# Patient Record
Sex: Male | Born: 1950
Health system: Southern US, Community
[De-identification: ages and names within clinical notes are randomized; demographics above are authoritative.]

## PROBLEM LIST (undated history)

## (undated) DIAGNOSIS — G2581 Restless legs syndrome: Secondary | ICD-10-CM

## (undated) DIAGNOSIS — E785 Hyperlipidemia, unspecified: Secondary | ICD-10-CM

## (undated) DIAGNOSIS — E039 Hypothyroidism, unspecified: Secondary | ICD-10-CM

## (undated) DIAGNOSIS — U071 COVID-19: Secondary | ICD-10-CM

## (undated) HISTORY — PX: REPLACEMENT TOTAL KNEE: SUR1224

## (undated) HISTORY — DX: Restless legs syndrome: G25.81

## (undated) HISTORY — PX: TONSILLECTOMY: SUR1361

## (undated) HISTORY — DX: Hypothyroidism, unspecified: E03.9

## (undated) HISTORY — DX: Hyperlipidemia, unspecified: E78.5

## (undated) HISTORY — DX: COVID-19: U07.1

---

## 1988-03-25 DIAGNOSIS — D869 Sarcoidosis, unspecified: Secondary | ICD-10-CM

## 1988-03-25 HISTORY — DX: Sarcoidosis, unspecified: D86.9

## 2015-08-31 ENCOUNTER — Ambulatory Visit (INDEPENDENT_AMBULATORY_CARE_PROVIDER_SITE_OTHER): Payer: BLUE CROSS/BLUE SHIELD | Admitting: Sports Medicine

## 2015-08-31 ENCOUNTER — Encounter: Payer: Self-pay | Admitting: Sports Medicine

## 2015-08-31 ENCOUNTER — Ambulatory Visit (INDEPENDENT_AMBULATORY_CARE_PROVIDER_SITE_OTHER): Payer: BLUE CROSS/BLUE SHIELD

## 2015-08-31 DIAGNOSIS — M722 Plantar fascial fibromatosis: Secondary | ICD-10-CM

## 2015-08-31 DIAGNOSIS — M79671 Pain in right foot: Secondary | ICD-10-CM

## 2015-08-31 MED ORDER — MELOXICAM 15 MG PO TABS
15.0000 mg | ORAL_TABLET | Freq: Every day | ORAL | Status: DC
Start: 2015-08-31 — End: 2015-10-12

## 2015-08-31 MED ORDER — TRIAMCINOLONE ACETONIDE 10 MG/ML IJ SUSP: 10.0000 mg | Freq: Once | INTRAMUSCULAR | Status: AC

## 2015-08-31 NOTE — Progress Notes (Signed)
Patient ID: Todd Ramirez, male   DOB: 02-May-1950, 64 y.o.   MRN: [id number] Subjective: Todd Ramirez is a 65 y.o. male patient presents to office with complaint of heel pain on the right. Patient admits to post static dyskinesia for 1 month in duration. Patient has treated this problem with wearing his old custom inserts with no relief. Denies any other pedal complaints.   There are no active problems to display for this patient.   No current outpatient prescriptions on file prior to visit.   No current facility-administered medications on file prior to visit.    No Known Allergies  Objective: Physical Exam General: The patient is alert and oriented x3 in no acute distress.  Dermatology: Skin is warm, dry and supple bilateral lower extremities. Nails 1-10 are normal. There is no erythema, edema, no eccymosis, no open lesions present. Integument is otherwise unremarkable.  Vascular: Dorsalis Pedis pulse and Posterior Tibial pulse are 2/4 bilateral. Capillary fill time is immediate to all digits.  Neurological: Grossly intact to light touch with an achilles reflex of +2/5 and a  negative Tinel's sign bilateral.  Musculoskeletal: Tenderness to palpation at the medial calcaneal tubercale and through the insertion of the plantar fascia on the right foot. No pain with compression of calcaneus bilateral. No pain with tuning fork to calcaneus bilateral. No pain with calf compression bilateral. There is decreased Ankle joint range of motion bilateral. All other joints range of motion within normal limits bilateral. Strength 5/5 in all groups bilateral. Mild asymptomatic hammertoe bilateral.   Xray, Right foot:  Normal osseous mineralization. Joint spaces preserved except at midfoot where there are changes suggestive of arthritis. Mild hammertoe. No fracture/dislocation/boney destruction. Calcaneal spur present with mild thickening of plantar fascia. No other soft tissue abnormalities or radiopaque  foreign bodies.   Assessment and Plan: Problem List Items Addressed This Visit    None    Visit Diagnoses    Right foot pain    -  Primary    Relevant Medications    triamcinolone acetonide (KENALOG) 10 MG/ML injection 10 mg    meloxicam (MOBIC) 15 MG tablet    Other Relevant Orders    DG Foot 2 Views Right    Plantar fasciitis of right foot        Relevant Medications    triamcinolone acetonide (KENALOG) 10 MG/ML injection 10 mg    meloxicam (MOBIC) 15 MG tablet       -Complete examination performed.  -Xrays reviewed -Discussed with patient in detail the condition of plantar fasciitis, how this occurs and general treatment options. Explained both conservative and surgical treatments.  -After oral consent and aseptic prep, injected a mixture containing 1 ml of 2%  plain lidocaine, 1 ml 0.5% plain marcaine, 0.5 ml of kenalog 10 and 0.5 ml of dexamethasone phosphate into right heel. Post-injection care discussed with patient.  -Rx Meloxicam -Recommended good supportive shoes and advised use of OTC insert. Explained to patient that if these orthoses work well, we will continue with these. If these do not improve his condition and  pain, we will consider custom molded orthoses. -Explained in detail the use of the fascial brace for right which was dispensed at today's visit. -Explained and dispensed to patient daily stretching exercises. -Recommend patient to ice affected area 1-2x daily. -Patient to return to office in 3 weeks for follow up or sooner if problems or questions arise. If pain is better will scan for new set of custom orthotics.  Landis Martins, DPM

## 2015-08-31 NOTE — Patient Instructions (Signed)

## 2015-09-21 ENCOUNTER — Ambulatory Visit (INDEPENDENT_AMBULATORY_CARE_PROVIDER_SITE_OTHER): Payer: BLUE CROSS/BLUE SHIELD | Admitting: Sports Medicine

## 2015-09-21 ENCOUNTER — Encounter: Payer: Self-pay | Admitting: Sports Medicine

## 2015-09-21 DIAGNOSIS — M79671 Pain in right foot: Secondary | ICD-10-CM | POA: Diagnosis not present

## 2015-09-21 DIAGNOSIS — M722 Plantar fascial fibromatosis: Secondary | ICD-10-CM

## 2015-09-21 NOTE — Progress Notes (Signed)
Patient ID: Todd Ramirez, male   DOB: [birth year], 65 y.o.   MRN: [id number] Subjective: Todd Ramirez is a 65 y.o. male returns to office for follow up evaluation after Right heel injection for plantar fasciitis, injection #1 administered 3 weeks ago. Patient states that the injection seems to help his pain; decreased in frequency to the area but still most bothersome with first few steps out of bed in the AM. Patient denies any recent changes in medications or new problems since last visit.   There are no active problems to display for this patient.   Current Outpatient Prescriptions on File Prior to Visit  Medication Sig Dispense Refill  . LEVOTHYROXINE SODIUM PO Take by mouth.    . meloxicam (MOBIC) 15 MG tablet Take 1 tablet (15 mg total) by mouth daily. 30 tablet 0   Current Facility-Administered Medications on File Prior to Visit  Medication Dose Route Frequency Provider Last Rate Last Dose  . triamcinolone acetonide (KENALOG) 10 MG/ML injection 10 mg  10 mg Other Once [employer], DPM        No Known Allergies  Objective:   General:  Alert and oriented x 3, in no acute distress  Dermatology: Skin is warm, dry, and supple bilateral. Nails are within normal limits. There is no lower extremity erythema, no eccymosis, no open lesions present bilateral.   Vascular: Dorsalis Pedis and Posterior Tibial pedal pulses are 2/4 bilateral. + hair growth noted bilateral. Capillary Fill Time is 3 seconds in all digits. No varicosities, No edema bilateral lower extremities.   Neurological: Sensation grossly intact to light touch with an achilles reflex of +2 and a  negative Tinel's sign bilateral. Vibratory, sharp/dull, Semmes Weinstein Monofilament within normal limits.   Musculoskeletal: There is decreased tenderness to palpation at the medial calcaneal tubercale and through the insertion of the plantar fascia on the right foot. No pain with compression to calcaneus or application of tuning fork.  There is decreased Ankle joint range of motion bilateral. All other jointsrange of motion  within normal limits bilateral. Strength 5/5 bilateral.   Assessment and Plan: Problem List Items Addressed This Visit    None    Visit Diagnoses    Plantar fasciitis of right foot    -  Primary    Right foot pain          -Complete examination performed.  -Previous x-rays reviewed. -Discussed with patient in detail the condition of plantar fasciitis, how this  occurs related to the foot type of the patient and general treatment options. -No re-injection today due to minimal symptom -Dispensed night splint, right foot -Continue with Mobic until completed -Continue with stretching, icing, good supportive shoes, inserts daily. Encouraged patient to ice since he has not been doing so -Discussed long term care and reocurrence; will closely monitor; if fails to improve will consider other treatment modalities.  -Patient to return to office in 3-4 weeks for follow up or sooner if problems or questions arise.  Landis Martins, DPM

## 2015-10-12 ENCOUNTER — Ambulatory Visit (INDEPENDENT_AMBULATORY_CARE_PROVIDER_SITE_OTHER): Payer: BLUE CROSS/BLUE SHIELD | Admitting: Sports Medicine

## 2015-10-12 ENCOUNTER — Encounter: Payer: Self-pay | Admitting: Sports Medicine

## 2015-10-12 DIAGNOSIS — M722 Plantar fascial fibromatosis: Secondary | ICD-10-CM | POA: Diagnosis not present

## 2015-10-12 DIAGNOSIS — M79671 Pain in right foot: Secondary | ICD-10-CM | POA: Diagnosis not present

## 2015-10-12 MED ORDER — MELOXICAM 15 MG PO TABS: 15.0000 mg | ORAL_TABLET | Freq: Every day | ORAL | Status: DC

## 2015-10-12 MED ORDER — TRIAMCINOLONE ACETONIDE 10 MG/ML IJ SUSP: 10.0000 mg | Freq: Once | INTRAMUSCULAR | Status: AC

## 2015-10-12 NOTE — Progress Notes (Signed)
Patient ID: Todd Ramirez, male   DOB: 04-20-1950, 64 y.o.   MRN: [id number]  Subjective: Todd Ramirez is a 65 y.o. male returns to office for follow up evaluation after Right heel injection for plantar fasciitis, injection #1 administered 6 weeks ago. Patient states that the injection seems to help his pain;decreased in frequency to the area but still painful with most experience pain last week after going on a long walk with the dogs and after mowing grass. Patient denies any recent changes in medications or new problems since last visit.   There are no active problems to display for this patient.   Current Outpatient Prescriptions on File Prior to Visit  Medication Sig Dispense Refill  . LEVOTHYROXINE SODIUM PO Take by mouth.     Current Facility-Administered Medications on File Prior to Visit  Medication Dose Route Frequency Provider Last Rate Last Dose  . triamcinolone acetonide (KENALOG) 10 MG/ML injection 10 mg  10 mg Other Once [employer], DPM        No Known Allergies  Objective:   General:  Alert and oriented x 3, in no acute distress  Dermatology: Skin is warm, dry, and supple bilateral. Nails are within normal limits. There is no lower extremity erythema, no eccymosis, no open lesions present bilateral.   Vascular: Dorsalis Pedis and Posterior Tibial pedal pulses are 2/4 bilateral. + hair growth noted bilateral. Capillary Fill Time is 3 seconds in all digits. No varicosities, No edema bilateral lower extremities.   Neurological: Sensation grossly intact to light touch with an achilles reflex of +2 and a  negative Tinel's sign bilateral. Vibratory, sharp/dull, Semmes Weinstein Monofilament within normal limits.   Musculoskeletal: There is tenderness to palpation at the medial calcaneal tubercale and through the insertion of the plantar fascia on the right foot. No pain with compression to calcaneus or application of tuning fork. There is decreased Ankle joint range of motion  bilateral. All other jointsrange of motion  within normal limits bilateral. Strength 5/5 bilateral.   Assessment and Plan: Problem List Items Addressed This Visit    None    Visit Diagnoses    Right foot pain    -  Primary    Relevant Medications    meloxicam (MOBIC) 15 MG tablet    triamcinolone acetonide (KENALOG) 10 MG/ML injection 10 mg    Plantar fasciitis of right foot        Relevant Medications    meloxicam (MOBIC) 15 MG tablet    triamcinolone acetonide (KENALOG) 10 MG/ML injection 10 mg      -Complete examination performed.  -Previous x-rays reviewed. -Discussed with patient in detail the condition of plantar fasciitis, how this  occurs related to the foot type of the patient and general treatment options. -After oral consent and aseptic prep, injected a mixture containing 1 ml of 2%  plain lidocaine, 1 ml 0.5% plain marcaine, 0.5 ml of kenalog 40 and 0.5 ml of dexamethasone phosphate into right heel at plantar fascia insertion medially without complication. Post-injection care discussed with patient. This is injection #2. -Continue with night splint, right foot -Refilled mobic to take for acute exacerbations or pain -Continue with stretching, icing, good supportive shoes, inserts daily. Encouraged patient to return to icing more consistently -Discussed long term care and reocurrence; will closely monitor; if fails to improve will consider other treatment modalities.  -Patient to return to office in 3-4 weeks for follow up or sooner if problems or questions arise. Advised patient that if he  is better with less flareups or recurrence, We'll scan and have orthotics made at next visit.  Landis Martins, DPM

## 2015-11-09 ENCOUNTER — Ambulatory Visit (INDEPENDENT_AMBULATORY_CARE_PROVIDER_SITE_OTHER): Payer: BLUE CROSS/BLUE SHIELD | Admitting: Sports Medicine

## 2015-11-09 ENCOUNTER — Encounter: Payer: Self-pay | Admitting: Sports Medicine

## 2015-11-09 DIAGNOSIS — M79671 Pain in right foot: Secondary | ICD-10-CM | POA: Diagnosis not present

## 2015-11-09 DIAGNOSIS — M722 Plantar fascial fibromatosis: Secondary | ICD-10-CM

## 2015-11-09 NOTE — Progress Notes (Signed)
  Patient ID: Todd Ramirez, male   DOB: Aug 20, 1950, 65 y.o.   MRN: [id number]  Subjective: Todd Ramirez is a 65 y.o. male returns to office for follow up evaluation after Right heel injection for plantar fasciitis, injection #2 administered 4 weeks ago. Patient states that the injection seems to help his pain; 100% resolved. Patient denies any recent changes in medications or new problems since last visit.   There are no active problems to display for this patient.   Current Outpatient Prescriptions on File Prior to Visit  Medication Sig Dispense Refill  . LEVOTHYROXINE SODIUM PO Take by mouth.    . meloxicam (MOBIC) 15 MG tablet Take 1 tablet (15 mg total) by mouth daily. 30 tablet 0   Current Facility-Administered Medications on File Prior to Visit  Medication Dose Route Frequency Provider Last Rate Last Dose  . triamcinolone acetonide (KENALOG) 10 MG/ML injection 10 mg  10 mg Other Once [employer], DPM      . triamcinolone acetonide (KENALOG) 10 MG/ML injection 10 mg  10 mg Other Once [employer], DPM        No Known Allergies  Objective:   General:  Alert and oriented x 3, in no acute distress  Dermatology: Skin is warm, dry, and supple bilateral. Nails are within normal limits. There is no lower extremity erythema, no eccymosis, no open lesions present bilateral.   Vascular: Dorsalis Pedis and Posterior Tibial pedal pulses are 2/4 bilateral. + hair growth noted bilateral. Capillary Fill Time is 3 seconds in all digits. No varicosities, No edema bilateral lower extremities.   Neurological: Sensation grossly intact to light touch with an achilles reflex of +2 and a negative Tinel's sign bilateral. Vibratory, sharp/dull, Semmes Weinstein Monofilament within normal limits.   Musculoskeletal: There is no tenderness to palpation at the medial calcaneal tubercale and through the insertion of the plantar fascia on the right foot. No pain with compression to calcaneus or application of  tuning fork. There is decreased Ankle joint range of motion bilateral. All other jointsrange of motion  within normal limits bilateral. Strength 5/5 bilateral.   Orthotic exam: Appear to contoured arch well however top cover is worn  Assessment and Plan: Problem List Items Addressed This Visit    None    Visit Diagnoses    Right foot pain    -  Primary   Plantar fasciitis of right foot         -Complete examination performed.  -Previous x-rays reviewed. -Discussed with patient in detail the condition of plantar fasciitis, And long term care -Patient to slowly wean from icing and use of the fascial brace and night splint, right foot as instructed -Continue with stretching, good supportive shoes, custom inserts daily. Advised patient that orthotics currently has fit arch however, if he is interested in getting a new pair or his current. Once refurbished to let me know -Patient to return to office as needed.   Landis Martins, DPM

## 2016-04-01 ENCOUNTER — Encounter: Payer: Self-pay | Admitting: Pulmonary Disease

## 2016-04-01 ENCOUNTER — Ambulatory Visit (INDEPENDENT_AMBULATORY_CARE_PROVIDER_SITE_OTHER): Payer: BLUE CROSS/BLUE SHIELD | Admitting: Pulmonary Disease

## 2016-04-01 VITALS — BP 120/90 | HR 61 | Ht 69.0 in | Wt 220.0 lb

## 2016-04-01 DIAGNOSIS — D869 Sarcoidosis, unspecified: Secondary | ICD-10-CM | POA: Diagnosis not present

## 2016-04-01 LAB — NITRIC OXIDE: Nitric Oxide: 13

## 2016-04-01 MED ORDER — AZELASTINE-FLUTICASONE 137-50 MCG/ACT NA SUSP: 2.0000 | Freq: Every day | NASAL | 1 refills | Status: DC

## 2016-04-01 MED ORDER — CHLORPHENIRAMINE MALEATE 4 MG PO TABS: 8.0000 mg | ORAL_TABLET | Freq: Three times a day (TID) | ORAL | 1 refills | Status: DC

## 2016-04-01 NOTE — Patient Instructions (Signed)
We'll check an FENO, PFTs You will be scheduled for a high-resolution CT We'll start him on Chlorpheniramine 8 mg 3 times daily and dymista nasal spray.  Return in 1 month.

## 2016-04-01 NOTE — Progress Notes (Signed)
Todd Ramirez    [id number]    24-Nov-1950  Primary Care Physician:GAGE, Fulton Mole, MD  Referring Physician: Ocie Doyne, MD [place], [place] [id number]  Chief complaint: Evaluation for chronic cough  HPI: Todd Ramirez is a 66 year old with past medical history of hypothyroidism, chronic cough, restless leg syndrome. He has complains of cough on and off for the past 20 years which is nonproductive in nature. He reports worsening of his symptoms for the past couple of months. He has paroxysms of cough which gets short of breath. He denies any wheezing, sputum production, fevers, chills. He was started on prednisone, Singulair, doxycycline last week by his primary care physician Dr. Micheal Likens which has helped with the symptoms.   He is a lifelong nonsmoker. 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 He reports a diagnosis of sarcoid in 1990 but does not recall getting a biopsy. He was treated with an anti-inflammatory therapy but is not sure if this was a steroid. He also has reports of wake arthritis symptoms. He had been seen by an allergist and also had a barium swallow which was negative for any evidence of aspiration.  Outpatient Encounter Prescriptions as of 04/01/2016  Medication Sig  . Doxycycline Hyclate (DOXY-CAPS PO) Take by mouth.  . levothyroxine (SYNTHROID, LEVOTHROID) 50 MCG tablet Take 50 mcg by mouth daily.  . montelukast (SINGULAIR) 10 MG tablet Take 10 mg by mouth daily.  . predniSONE (DELTASONE) 10 MG tablet TAKE 4 TABLETS FOR 2 DAYS, 3 TABLETS FOR 2 DAYS, 2 TABLETS FOR 2 DAYS, 1 TABLET FOR 2 DAYS.  Marland Kitchen rOPINIRole (REQUIP) 1 MG tablet TAKE 1 TABLET AT BEDTIME (MAXIMUM OF 4 TABLETS AT BEDTIME)  . LEVOTHYROXINE SODIUM PO Take by mouth.  . meloxicam (MOBIC) 15 MG tablet Take 1 tablet (15 mg total) by mouth daily. (Patient not taking: Reported on 04/01/2016)   Facility-Administered Encounter Medications as of 04/01/2016  Medication  . triamcinolone acetonide (KENALOG) 10 MG/ML  injection 10 mg  . triamcinolone acetonide (KENALOG) 10 MG/ML injection 10 mg    Allergies as of 04/01/2016  . (No Known Allergies)    No past medical history on file.  No past surgical history on file.  No family history on file.  Social History   Social History  . Marital status: Married    Spouse name: N/A  . Number of children: N/A  . Years of education: N/A   Occupational History  . Not on file.   Social History Main Topics  . Smoking status: Never Smoker  . Smokeless tobacco: Never Used  . Alcohol use Not on file  . Drug use: Unknown  . Sexual activity: Not on file   Other Topics Concern  . Not on file   Social History Narrative  . No narrative on file   Review of systems: Review of Systems  Constitutional: Negative for fever and chills.  HENT: Negative.   Eyes: Negative for blurred vision.  Respiratory: as per HPI  Cardiovascular: Negative for chest pain and palpitations.  Gastrointestinal: Negative for vomiting, diarrhea, blood per rectum. Genitourinary: Negative for dysuria, urgency, frequency and hematuria.  Musculoskeletal: Negative for myalgias, back pain and joint pain.  Skin: Negative for itching and rash.  Neurological: Negative for dizziness, tremors, focal weakness, seizures and loss of consciousness.  Endo/Heme/Allergies: Negative for environmental allergies.  Psychiatric/Behavioral: Negative for depression, suicidal ideas and hallucinations.  All other systems reviewed and are  negative.  Physical Exam: Blood pressure 120/90, pulse 61, height 5\' 9"  (1.753 m), weight 220 lb (99.8 kg), SpO2 94 %. Gen:      No acute distress HEENT:  EOMI, sclera anicteric Neck:     No masses; no thyromegaly Lungs:    Clear to auscultation bilaterally; normal respiratory effort CV:         Regular rate and rhythm; no murmurs Abd:      + bowel sounds; soft, non-tender; no palpable masses, no distension Ext:    No edema; adequate peripheral perfusion Skin:       Warm and dry; no rash Neuro: alert and oriented x 3 Psych: normal mood and affect  Data Reviewed: Chest x-ray 03/29/16-no acute cardio pulmonary abnormality. Images reviewed. FENO 04/01/16- 13  Assessment:  Evaluation for chronic cough I suspect his cough is upper airway cough syndrome mostly from postnasal drip. He does not have any symptoms of GERD, aspiration. He gives a vague history of sarcoidosis in the 1990s but his recent chest x-ray was normal.  Low FENO in office today argues against asthma. He is on Requip for restless leg syndrome. He does snore occasionally at night but has not been tested for sleep apnea.  I will reevaluate with an  high-resolution CT of the chest and PFTs. He will get started on chlorpheniramine antihistamine 8 mg 3 times daily and dymista nasal spray and also get records from Dr. Viviann Spare office to get a better idea of his sarcoidosis history. He may need a sleep study for evaluation of OSA. I will address this at next visit.   I educated him on behavioral changes to deal with cough including conscious suppression of the urge to cough, use of throat lozenges.  Plan/Recommendations: - PFTs, High res CT of chest - Chlorpheniramine and dymista - Get records from PCP  Marshell Garfinkel MD [facility] Pager [phone] 04/01/2016, 11:59 AM  CC: Ocie Doyne., MD

## 2016-04-10 ENCOUNTER — Inpatient Hospital Stay: Admission: RE | Admit: 2016-04-10 | Payer: BLUE CROSS/BLUE SHIELD | Source: Ambulatory Visit

## 2016-04-12 ENCOUNTER — Inpatient Hospital Stay: Admission: RE | Admit: 2016-04-12 | Payer: BLUE CROSS/BLUE SHIELD | Source: Ambulatory Visit

## 2016-04-18 ENCOUNTER — Ambulatory Visit (INDEPENDENT_AMBULATORY_CARE_PROVIDER_SITE_OTHER)
Admission: RE | Admit: 2016-04-18 | Discharge: 2016-04-18 | Disposition: A | Discharge status: Home / Self Care | Payer: BLUE CROSS/BLUE SHIELD | Source: Ambulatory Visit | Attending: Pulmonary Disease | Admitting: Pulmonary Disease

## 2016-04-18 DIAGNOSIS — D869 Sarcoidosis, unspecified: Secondary | ICD-10-CM

## 2016-04-25 ENCOUNTER — Telehealth: Payer: Self-pay | Admitting: Pulmonary Disease

## 2016-04-25 NOTE — Telephone Encounter (Signed)
Notes Recorded by Marshell Garfinkel, MD on 04/19/2016 at 11:45 AM EST Please let the patient know that CT does not show any evidence of interstitial lung disease or sarcoidosis. He has some fat in liver and also noted is atherosclerosis in coronary vessels that may indicate he has cardiac diease. Please have him follow up with his primary care physician.  lmtcb X1

## 2016-04-29 NOTE — Telephone Encounter (Signed)
Spoke with pt. He is aware of results. Nothing further was needed. 

## 2016-05-02 ENCOUNTER — Ambulatory Visit (HOSPITAL_COMMUNITY)
Admission: RE | Admit: 2016-05-02 | Discharge: 2016-05-02 | Disposition: A | Discharge status: Home / Self Care | Payer: BLUE CROSS/BLUE SHIELD | Source: Ambulatory Visit | Attending: Pulmonary Disease | Admitting: Pulmonary Disease

## 2016-05-02 ENCOUNTER — Encounter: Payer: Self-pay | Admitting: Pulmonary Disease

## 2016-05-02 ENCOUNTER — Ambulatory Visit (INDEPENDENT_AMBULATORY_CARE_PROVIDER_SITE_OTHER): Payer: BLUE CROSS/BLUE SHIELD | Admitting: Pulmonary Disease

## 2016-05-02 VITALS — BP 130/76 | HR 94 | Ht 69.0 in | Wt 215.6 lb

## 2016-05-02 DIAGNOSIS — D869 Sarcoidosis, unspecified: Secondary | ICD-10-CM | POA: Insufficient documentation

## 2016-05-02 DIAGNOSIS — G4733 Obstructive sleep apnea (adult) (pediatric): Secondary | ICD-10-CM

## 2016-05-02 LAB — PULMONARY FUNCTION TEST
DL/VA % PRED: 84 %
DL/VA: 3.83 ml/min/mmHg/L
DLCO UNC % PRED: 73 %
DLCO unc: 22.65 ml/min/mmHg
FEF 25-75 POST: 2.99 L/s
FEF 25-75 Pre: 4.3 L/sec
FEF2575-%CHANGE-POST: -30 %
FEF2575-%PRED-POST: 117 %
FEF2575-%Pred-Pre: 168 %
FEV1-%CHANGE-POST: -7 %
FEV1-%Pred-Post: 102 %
FEV1-%Pred-Pre: 111 %
FEV1-POST: 3.33 L
FEV1-Pre: 3.62 L
FEV1FVC-%CHANGE-POST: -1 %
FEV1FVC-%Pred-Pre: 112 %
FEV6-%CHANGE-POST: -6 %
FEV6-%Pred-Post: 97 %
FEV6-%Pred-Pre: 104 %
FEV6-PRE: 4.33 L
FEV6-Post: 4.04 L
FEV6FVC-%Change-Post: 0 %
FEV6FVC-%PRED-PRE: 105 %
FEV6FVC-%Pred-Post: 105 %
FVC-%CHANGE-POST: -6 %
FVC-%PRED-POST: 92 %
FVC-%Pred-Pre: 98 %
FVC-Post: 4.05 L
FVC-Pre: 4.33 L
POST FEV1/FVC RATIO: 82 %
PRE FEV6/FVC RATIO: 100 %
Post FEV6/FVC ratio: 100 %
Pre FEV1/FVC ratio: 84 %
RV % PRED: 88 %
RV: 2.05 L
TLC % pred: 97 %
TLC: 6.67 L

## 2016-05-02 MED ORDER — ALBUTEROL SULFATE (2.5 MG/3ML) 0.083% IN NEBU
2.5000 mg | INHALATION_SOLUTION | Freq: Once | RESPIRATORY_TRACT | Status: AC
  Administered 2016-05-02: 2.5 mg via RESPIRATORY_TRACT

## 2016-05-02 MED ORDER — PANTOPRAZOLE SODIUM 40 MG PO TBEC: 40.0000 mg | DELAYED_RELEASE_TABLET | Freq: Every day | ORAL | 2 refills | Status: DC

## 2016-05-02 NOTE — Progress Notes (Signed)
Todd Ramirez    [id number]    10/23/1950  Primary Care Physician:Todd Ramirez, Todd Mole, MD  Referring Physician: Ocie Doyne, MD [place], [place] [address]  Chief complaint: Follow-up for chronic cough  HPI: Mr. Todd Ramirez is a 66 year old with past medical history of hypothyroidism, chronic cough, restless leg syndrome. He has complains of cough on and off for the past 20 years which is nonproductive in nature. He reports worsening of his symptoms for the past couple of months. He has paroxysms of cough which gets short of breath. He denies any wheezing, sputum production, fevers, chills. He was started on prednisone, Singulair, doxycycline last week by his primary care physician Dr. Micheal Ramirez which has helped with the symptoms.   He is a lifelong nonsmoker. 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 He reports a diagnosis of sarcoid in 1990 but does not recall getting a biopsy. He was treated with an anti-inflammatory therapy but is not sure if this was a steroid. He also has reports of wake arthritis symptoms. He had been seen by an allergist and also had a barium swallow which was negative for any evidence of aspiration.  Interim History: Feels improved and he states that the cough is better with no paroxysms but he has some ongoing chronic cough at baseline. He denies any fevers, chills, dyspnea, wheezing.  Outpatient Encounter Prescriptions as of 05/02/2016  Medication Sig  . Azelastine-Fluticasone 137-50 MCG/ACT SUSP Place 2 sprays into both nostrils daily.  . chlorpheniramine (CHLOR-TRIMETON) 4 MG tablet Take 2 tablets (8 mg total) by mouth 3 (three) times daily.  Marland Kitchen levothyroxine (SYNTHROID, LEVOTHROID) 50 MCG tablet Take 50 mcg by mouth daily.  . meloxicam (MOBIC) 15 MG tablet Take 1 tablet (15 mg total) by mouth daily.  Marland Kitchen rOPINIRole (REQUIP) 1 MG tablet TAKE 1 TABLET AT BEDTIME (MAXIMUM OF 4 TABLETS AT BEDTIME)  . [DISCONTINUED] Doxycycline Hyclate (DOXY-CAPS PO) Take by mouth.  .  [DISCONTINUED] LEVOTHYROXINE SODIUM PO Take by mouth.  . [DISCONTINUED] montelukast (SINGULAIR) 10 MG tablet Take 10 mg by mouth daily.  . [DISCONTINUED] predniSONE (DELTASONE) 10 MG tablet TAKE 4 TABLETS FOR 2 DAYS, 3 TABLETS FOR 2 DAYS, 2 TABLETS FOR 2 DAYS, 1 TABLET FOR 2 DAYS.   Facility-Administered Encounter Medications as of 05/02/2016  Medication  . triamcinolone acetonide (KENALOG) 10 MG/ML injection 10 mg  . triamcinolone acetonide (KENALOG) 10 MG/ML injection 10 mg    Allergies as of 05/02/2016  . (No Known Allergies)    No past medical history on file.  No past surgical history on file.  No family history on file.  Social History   Social History  . Marital status: Married    Spouse name: N/A  . Number of children: N/A  . Years of education: N/A   Occupational History  . Not on file.   Social History Main Topics  . Smoking status: Never Smoker  . Smokeless tobacco: Never Used  . Alcohol use Not on file  . Drug use: Unknown  . Sexual activity: Not on file   Other Topics Concern  . Not on file   Social History Narrative  . No narrative on file   Review of systems: Review of Systems  Constitutional: Negative for fever and chills.  HENT: Negative.   Eyes: Negative for blurred vision.  Respiratory: as per HPI  Cardiovascular: Negative for chest pain and palpitations.  Gastrointestinal: Negative for vomiting, diarrhea, blood  per rectum. Genitourinary: Negative for dysuria, urgency, frequency and hematuria.  Musculoskeletal: Negative for myalgias, back pain and joint pain.  Skin: Negative for itching and rash.  Neurological: Negative for dizziness, tremors, focal weakness, seizures and loss of consciousness.  Endo/Heme/Allergies: Negative for environmental allergies.  Psychiatric/Behavioral: Negative for depression, suicidal ideas and hallucinations.  All other systems reviewed and are negative.  Physical Exam: Blood pressure 130/76, pulse 94, height 5'  9" (1.753 m), weight 97.8 kg (215 lb 9.6 oz), SpO2 95 %. Gen:      No acute distress HEENT:  EOMI, sclera anicteric Neck:     No masses; no thyromegaly Lungs:    Clear to auscultation bilaterally; normal respiratory effort CV:         Regular rate and rhythm; no murmurs Abd:      + bowel sounds; soft, non-tender; no palpable masses, no distension Ext:    No edema; adequate peripheral perfusion Skin:      Warm and dry; no rash Neuro: alert and oriented x 3 Psych: normal mood and affect Data Reviewed: Chest x-ray 03/29/16-no acute cardio pulmonary abnormality. Images reviewed. CT high resolution 04/18/16- no evidence of interstitial lung disease or sarcoidosis. Aortic and coronary atherosclerosis, hepatic steatosis.  FENO 04/01/16- 13  PFTs 05/02/16 FVC 4.05 [92%) FEV1 3.33 [102%) F/F 82 TLC 97% DLCO 73% Minimal diffusion defect.  Assessment:  Follow up for chronic cough Cough is likely upper airway cough syndrome mostly from postnasal drip. He has a vague history of sarcoidosis in the 1990s but CT of the chest does not show any evidence of this or other interstitial lung disease. Low FENO in office argues against asthma. PFTs were reviewed with him today which not show any obstruction but a minimal diffusion defect.  He will continue on the chlorpheniramine and dymista. He does not have any overt GERD symptoms but we will start him on Protonix for empiric treatment of acid reflux.   I have reviewed his CT and PFTs with him today. He'll follow up with his primary care for evaluation of the coronary atherosclerosis and hepatic steatosis  Eval for OSA He is in requip for restless leg syndrome with symptoms of snoring, witnessed apneas and daytime sleepiness. We'll set him up with a sleep study for further evaluation  Plan/Recommendations: - Continue Chlorpheniramine and dymista - Start protonix 40 mg qd - Sleep study  Todd Garfinkel MD [facility] Pager [phone]  [phone] 05/02/2016, 12:12 PM  CC: Todd Ramirez., MD

## 2016-05-02 NOTE — Patient Instructions (Signed)
We discussed your lung function tests and CT scan today. If any lung function tests show very minimal changes which are not of great concern. Your CT scan does not show any evidence of sarcoid or lung abnormalities. There is evidence of plaque buildup in the blood vessels supplying your heart. This may indicate that you are developing coronary artery disease. You can follow up with your primary care or cardiology for further evaluation. There is also evidence of fatty liver on the CT scan.  Regarding your cough I don't believe it's coming from the lungs This is likely from postnasal drip and silent acid reflux Continue chlorpheniramine antihistamine and dymista nasal spray. We will start you on Protonix 40 mg daily for acid reflux We will also schedule for a sleep study for evaluation of sleep apnea  Return to clinic in 3 months.

## 2016-05-06 NOTE — Progress Notes (Signed)
Spoke with patient and informed him of results and recommendations. Pt had no additional questions. Nothing further needed.

## 2016-05-13 ENCOUNTER — Telehealth: Payer: Self-pay | Admitting: Pulmonary Disease

## 2016-05-13 NOTE — Telephone Encounter (Signed)
PM  Please Advise-  Pt stated he just wanted his PFT results again, he understands that you two discussed it during his visit he just wants to make sure that there was no concerns that you saw because he did not feel like he did very well on it.

## 2016-05-14 DIAGNOSIS — I2510 Atherosclerotic heart disease of native coronary artery without angina pectoris: Secondary | ICD-10-CM

## 2016-05-14 HISTORY — DX: Atherosclerotic heart disease of native coronary artery without angina pectoris: I25.10

## 2016-05-14 NOTE — Telephone Encounter (Signed)
Please let the patient know that the PFTs show only minimal changes. There is minimal reduction in the diffusion capacity. I don't believe this is significant or anything to worry about. He did well in the rest of the test.  PM

## 2016-05-14 NOTE — Telephone Encounter (Signed)
lmomtcb x1 

## 2016-05-14 NOTE — Telephone Encounter (Signed)
Patient called back - he can be reached at [phone] -pr

## 2016-05-14 NOTE — Telephone Encounter (Signed)
Spoke with pt. He is aware of PFT results. Nothing further was needed.

## 2016-06-10 ENCOUNTER — Telehealth: Payer: Self-pay | Admitting: Pulmonary Disease

## 2016-06-10 NOTE — Telephone Encounter (Signed)
Spoke with pt. He is scheduled for a split night study on 06/12/16. Advised him that this type of sleep study can't be done at home. He verbalized understanding. Nothing further was needed.

## 2016-06-12 ENCOUNTER — Ambulatory Visit (HOSPITAL_BASED_OUTPATIENT_CLINIC_OR_DEPARTMENT_OTHER): Payer: BLUE CROSS/BLUE SHIELD | Attending: Pulmonary Disease | Admitting: Pulmonary Disease

## 2016-06-12 DIAGNOSIS — G4733 Obstructive sleep apnea (adult) (pediatric): Secondary | ICD-10-CM

## 2016-06-12 DIAGNOSIS — G4730 Sleep apnea, unspecified: Secondary | ICD-10-CM | POA: Insufficient documentation

## 2016-06-20 DIAGNOSIS — G4730 Sleep apnea, unspecified: Secondary | ICD-10-CM | POA: Diagnosis not present

## 2016-06-20 HISTORY — DX: Sleep apnea, unspecified: G47.30

## 2016-06-20 NOTE — Procedures (Signed)
    Patient Name: Todd Ramirez, Todd Ramirez Date: 06/12/2016   Gender: Male  D.O.B: [birth year]  Age (years): 36  Referring Provider: Marshell Garfinkel   Height (inches): 60  Interpreting Physician: Chesley Mires MD, ABSM  Weight (lbs): 207  RPSGT: Earney Hamburg   BMI: 31  MRN: [id number]  Neck Size: 16.50    CLINICAL INFORMATION  Sleep Study Type: NPSG  Indication for sleep study: OSA  Epworth Sleepiness Score: 16   SLEEP STUDY TECHNIQUE  As per the AASM Manual for the Scoring of Sleep and Associated Events v2.3 (April 2016) with a hypopnea requiring 4% desaturations.  The channels recorded and monitored were frontal, central and occipital EEG, electrooculogram (EOG), submentalis EMG (chin), nasal and oral airflow, thoracic and abdominal wall motion, anterior tibialis EMG, snore microphone, electrocardiogram, and pulse oximetry.   MEDICATIONS  Medications self-administered by patient taken the night of the study : N/A   SLEEP ARCHITECTURE  The study was initiated at 9:59:40 PM and ended at 4:28:12 AM.  Sleep onset time was 6.8 minutes and the sleep efficiency was 81.1%. The total sleep time was 315.0 minutes.  Stage REM latency was 123.5 minutes.  The patient spent 6.51% of the night in stage N1 sleep, 78.25% in stage N2 sleep, 0.00% in stage N3 and 15.24% in REM.  Alpha intrusion was absent.  Supine sleep was 16.31%.   RESPIRATORY PARAMETERS  The overal respiratory disturbance index (RDI) was 5.5. There were 0 total apneas, including 0 obstructive, 0 central and 0 mixed apneas. There were 15 hypopneas and 14 RERAs. The AHI during Stage REM sleep was 6.3 per hour. AHI while supine was 8.2 per hour. The mean oxygen saturation was 89.93%. Moderate snoring was noted during this study.  CARDIAC DATA  The 2 lead EKG demonstrated sinus rhythm. The mean heart rate was 58.08 beats per minute. Other EKG findings include: PVCs.   LEG MOVEMENT DATA  The total PLMS were 258 with a  resulting PLMS index of 49.14. Associated arousal with leg movement index was 0.2 .  IMPRESSIONS  - This study shows mild sleep apnea with an RDI of 5.5. He had a significant REM effect.  DIAGNOSIS  - Sleep apnea, unspecified (G47.30)  RECOMMENDATIONS  - Additional therapies include weight loss, CPAP, oral appliance, and surgical assessment.  [Electronically signed] 06/20/2016 02:23 AM Chesley Mires MD, ABSM  Diplomate, American Board of Sleep Medicine  NPI: [id number]

## 2016-07-01 NOTE — Telephone Encounter (Signed)
Please let the patient know that the sleep study shows very mild sleep apnea. At this point I would recommend that he treat this by trying to loose weight, we can consider CPAP in future if the issue does not improve. I will discuss further with him at time of return clinic visit.

## 2016-07-01 NOTE — Telephone Encounter (Signed)
lmtcb x1 for pt. Will await call back.

## 2016-07-08 NOTE — Telephone Encounter (Signed)
Lmtcb x2 for with pt's assistant Will await call back

## 2016-07-15 NOTE — Telephone Encounter (Signed)
Spoke with pt and informed him of PM's message. Pt understood and had no further questions. Nothing further is needed. He was also made aware of his follow up appointment.

## 2016-08-12 ENCOUNTER — Encounter: Payer: Self-pay | Admitting: Pulmonary Disease

## 2016-08-12 ENCOUNTER — Ambulatory Visit (INDEPENDENT_AMBULATORY_CARE_PROVIDER_SITE_OTHER): Payer: BLUE CROSS/BLUE SHIELD | Admitting: Pulmonary Disease

## 2016-08-12 VITALS — BP 124/82 | HR 62 | Ht 69.0 in | Wt 218.2 lb

## 2016-08-12 DIAGNOSIS — R05 Cough: Secondary | ICD-10-CM | POA: Diagnosis not present

## 2016-08-12 DIAGNOSIS — R059 Cough, unspecified: Secondary | ICD-10-CM

## 2016-08-12 NOTE — Patient Instructions (Signed)
You can use the chlorpheniramine over the counter as needed Use flonase nasal spray as needed instead of the dymisata  Return to clinic as needed

## 2016-08-12 NOTE — Progress Notes (Signed)
Todd Ramirez    [id number]    Jul 27, 1950  Primary Care Physician:Gage, Fulton Mole., MD  Referring Physician: Ocie Doyne., MD [place], [place] [address]  Chief complaint: Follow-up for chronic cough  HPI: Todd Ramirez is a 66 year old with past medical history of hypothyroidism, chronic cough, restless leg syndrome. He has complains of cough on and off for the past 20 years which is nonproductive in nature. He reports worsening of his symptoms for the past couple of months. He has paroxysms of cough which gets short of breath. He denies any wheezing, sputum production, fevers, chills. He was started on prednisone, Singulair, doxycycline last week by his primary care physician Dr. Micheal Likens which has helped with the symptoms.   He is a lifelong nonsmoker. 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 He reports a diagnosis of sarcoid in 1990 but does not recall getting a biopsy. He was treated with an anti-inflammatory therapy but is not sure if this was a steroid. He also has reports of wake arthritis symptoms. He had been seen by an allergist and also had a barium swallow which was negative for any evidence of aspiration.  Interim History: States that the cough is improved. He still has some nonproductive cough when he wakes up but not through the day. Denies any  sputum production, dyspnea, fevers.  Outpatient Encounter Prescriptions as of 08/12/2016  Medication Sig  . aspirin EC 81 MG tablet Take 81 mg by mouth.  . levothyroxine (SYNTHROID, LEVOTHROID) 50 MCG tablet Take 50 mcg by mouth daily.  . meloxicam (MOBIC) 15 MG tablet Take 1 tablet (15 mg total) by mouth daily.  . Multiple Vitamin (MULTI-VITAMINS) TABS Take by mouth.  Marland Kitchen rOPINIRole (REQUIP) 1 MG tablet TAKE 1 TABLET AT BEDTIME (MAXIMUM OF 4 TABLETS AT BEDTIME)  . Azelastine-Fluticasone 137-50 MCG/ACT SUSP Place 2 sprays into both nostrils daily. (Patient not taking: Reported on 08/12/2016)  . chlorpheniramine (CHLOR-TRIMETON)  4 MG tablet Take 2 tablets (8 mg total) by mouth 3 (three) times daily. (Patient not taking: Reported on 08/12/2016)  . pantoprazole (PROTONIX) 40 MG tablet Take 1 tablet (40 mg total) by mouth daily. (Patient not taking: Reported on 08/12/2016)   Facility-Administered Encounter Medications as of 08/12/2016  Medication  . triamcinolone acetonide (KENALOG) 10 MG/ML injection 10 mg  . triamcinolone acetonide (KENALOG) 10 MG/ML injection 10 mg    Allergies as of 08/12/2016  . (No Known Allergies)    No past medical history on file.  No past surgical history on file.  No family history on file.  Social History   Social History  . Marital status: Married    Spouse name: N/A  . Number of children: N/A  . Years of education: N/A   Occupational History  . Not on file.   Social History Main Topics  . Smoking status: Never Smoker  . Smokeless tobacco: Never Used  . Alcohol use Not on file  . Drug use: Unknown  . Sexual activity: Not on file   Other Topics Concern  . Not on file   Social History Narrative  . No narrative on file   Review of systems: Review of Systems  Constitutional: Negative for fever and chills.  HENT: Negative.   Eyes: Negative for blurred vision.  Respiratory: as per HPI  Cardiovascular: Negative for chest pain and palpitations.  Gastrointestinal: Negative for vomiting, diarrhea, blood per rectum. Genitourinary: Negative for dysuria, urgency, frequency  and hematuria.  Musculoskeletal: Negative for myalgias, back pain and joint pain.  Skin: Negative for itching and rash.  Neurological: Negative for dizziness, tremors, focal weakness, seizures and loss of consciousness.  Endo/Heme/Allergies: Negative for environmental allergies.  Psychiatric/Behavioral: Negative for depression, suicidal ideas and hallucinations.  All other systems reviewed and are negative.  Physical Exam: Blood pressure 124/82, pulse 62, height 5\' 9"  (1.753 m), weight 218 lb 3.2 oz  (99 kg), SpO2 95 %. Gen:      No acute distress HEENT:  EOMI, sclera anicteric Neck:     No masses; no thyromegaly Lungs:    Clear to auscultation bilaterally; normal respiratory effort CV:         Regular rate and rhythm; no murmurs Abd:      + bowel sounds; soft, non-tender; no palpable masses, no distension Ext:    No edema; adequate peripheral perfusion Skin:      Warm and dry; no rash Neuro: alert and oriented x 3 Psych: normal mood and affect  Data Reviewed: Chest x-ray 03/29/16-no acute cardio pulmonary abnormality. Images reviewed. CT high resolution 04/18/16- no evidence of interstitial lung disease or sarcoidosis. Aortic and coronary atherosclerosis, hepatic steatosis. I have reviewed all images personally  FENO 04/01/16- 13  PFTs 05/02/16 FVC 4.05 [92%) FEV1 3.33 [102%) F/F 82 TLC 97% DLCO 73% Minimal diffusion defect.  Sleep Study 06/12/16- This study shows mild sleep apnea with an RDI of 5.5. He had a significant REM effect  Assessment:  Follow up for chronic cough Cough is likely upper airway cough syndrome mostly from postnasal drip. He has a vague history of sarcoidosis in the 1990s but CT of the chest does not show any evidence of this or other interstitial lung disease. Low FENO in office argues against asthma. PFTs were reviewed with him today which not show any obstruction but a minimal diffusion defect.  He had a workup done as reviewed above which is unremarkable for any significant lung disease. He continues to chlorpheniramine antihistamine and Flonase nasal spray (instead of dymista) over-the-counter as needed.  Eval for OSA He is on requip for restless leg syndrome with symptoms of snoring, witnessed apneas and daytime sleepiness. OSA shows very mild sleep apnea. I have recommended that he try weight loss first before attempting CPAP therapy  Plan/Recommendations: - Continue Chlorpheniramine and flonase as needed - Continue protonix  Return as  needed.  Marshell Garfinkel MD [facility] Pager [phone] 08/12/2016, 9:21 AM  CC: Ocie Doyne., MD

## 2017-03-04 DIAGNOSIS — E039 Hypothyroidism, unspecified: Secondary | ICD-10-CM | POA: Diagnosis not present

## 2017-03-04 DIAGNOSIS — G8918 Other acute postprocedural pain: Secondary | ICD-10-CM | POA: Diagnosis not present

## 2017-03-04 DIAGNOSIS — K5900 Constipation, unspecified: Secondary | ICD-10-CM | POA: Diagnosis not present

## 2017-03-04 DIAGNOSIS — M75111 Incomplete rotator cuff tear or rupture of right shoulder, not specified as traumatic: Secondary | ICD-10-CM | POA: Diagnosis not present

## 2017-03-04 DIAGNOSIS — M129 Arthropathy, unspecified: Secondary | ICD-10-CM | POA: Diagnosis not present

## 2017-03-04 DIAGNOSIS — M25511 Pain in right shoulder: Secondary | ICD-10-CM | POA: Diagnosis not present

## 2017-03-04 DIAGNOSIS — M7541 Impingement syndrome of right shoulder: Secondary | ICD-10-CM | POA: Diagnosis not present

## 2017-03-04 DIAGNOSIS — Z7982 Long term (current) use of aspirin: Secondary | ICD-10-CM | POA: Diagnosis not present

## 2017-03-04 DIAGNOSIS — M7551 Bursitis of right shoulder: Secondary | ICD-10-CM | POA: Diagnosis not present

## 2017-03-04 DIAGNOSIS — M75112 Incomplete rotator cuff tear or rupture of left shoulder, not specified as traumatic: Secondary | ICD-10-CM | POA: Diagnosis not present

## 2017-03-04 DIAGNOSIS — G8929 Other chronic pain: Secondary | ICD-10-CM | POA: Diagnosis not present

## 2017-03-04 DIAGNOSIS — E785 Hyperlipidemia, unspecified: Secondary | ICD-10-CM | POA: Diagnosis not present

## 2017-03-04 DIAGNOSIS — M19011 Primary osteoarthritis, right shoulder: Secondary | ICD-10-CM | POA: Diagnosis not present

## 2017-03-06 IMAGING — CT CT CHEST HIGH RESOLUTION W/O CM
2 of 6 series · 14 of 36 positions shown, 17 images · non-contrast
Comparison: No priors.

CLINICAL DATA: 65-year-old male with history of chronic cough.
History of sarcoidosis.

EXAM:
CT CHEST WITHOUT CONTRAST
TECHNIQUE: Multidetector CT imaging of the chest was performed following the
standard protocol without intravenous contrast. High resolution
imaging of the lungs, as well as inspiratory and expiratory imaging,
was performed.

[Series 2: high resolution · axial · 0.76mm/px · z∈[-300,-38]mm · 11 of 147 slices shown, 14 images]
[im 8/147  mediastinal]
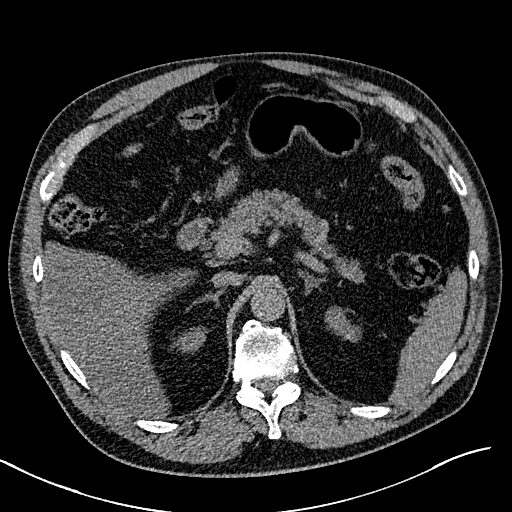
[im 8/147  lung]
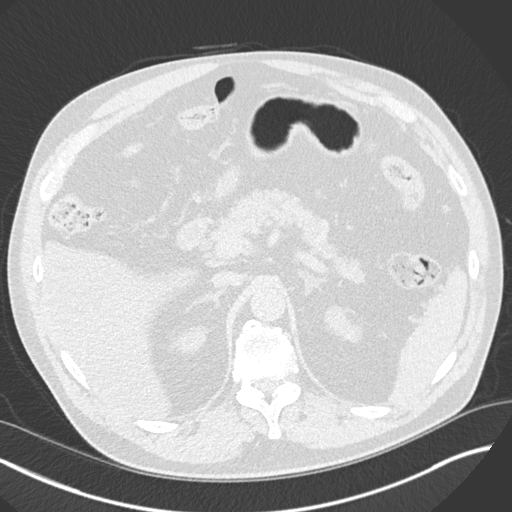
[im 22/147  lung]
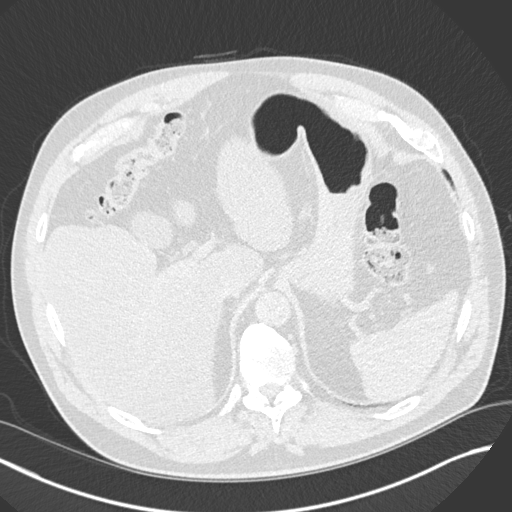
[im 37/147  lung]
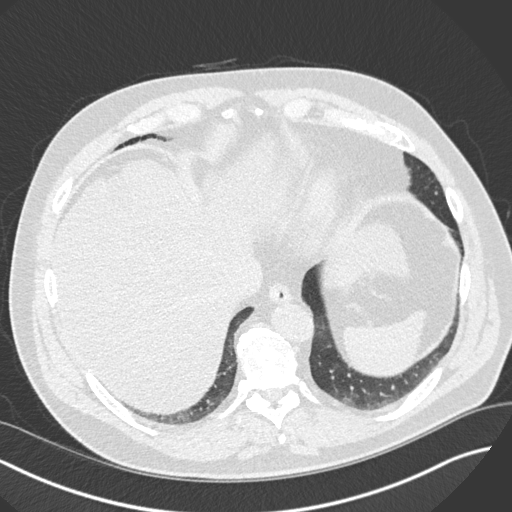
[im 52/147  lung]
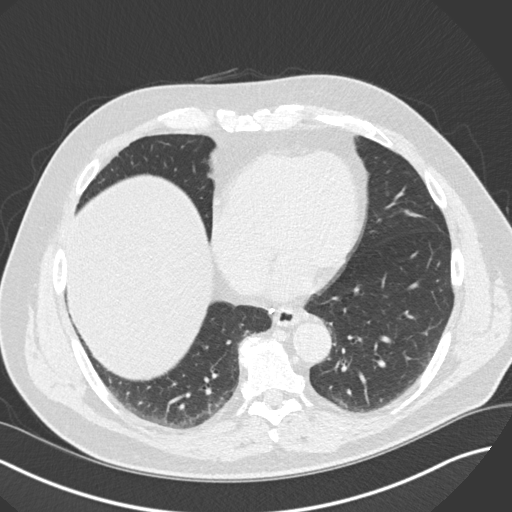
[im 59/147  mediastinal]
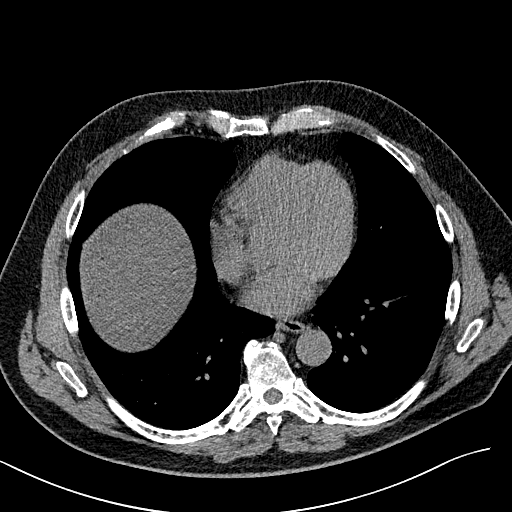
[im 59/147  lung]
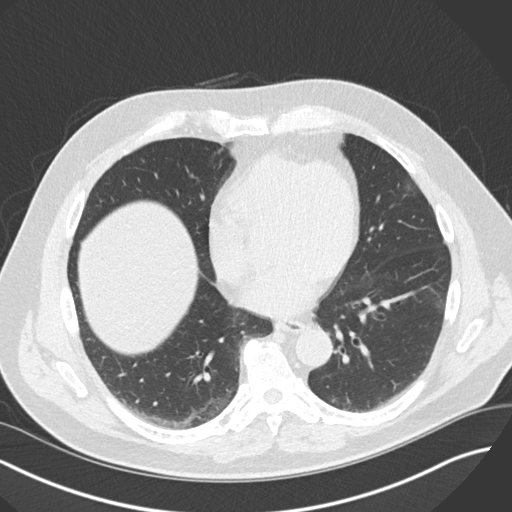
[im 74/147  lung]
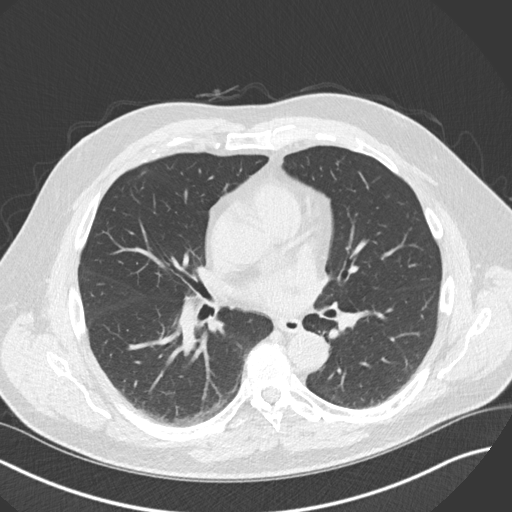
[im 88/147  lung]
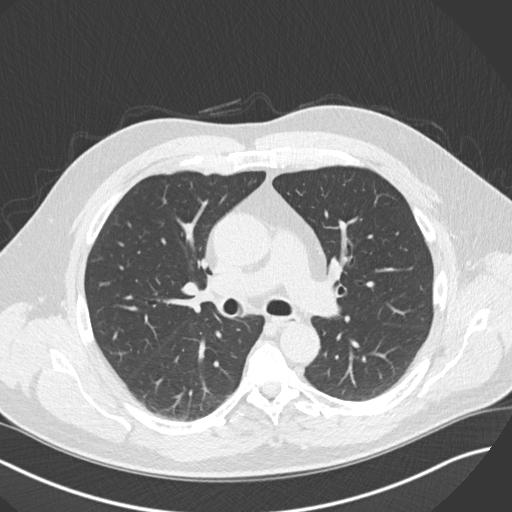
[im 95/147  lung]
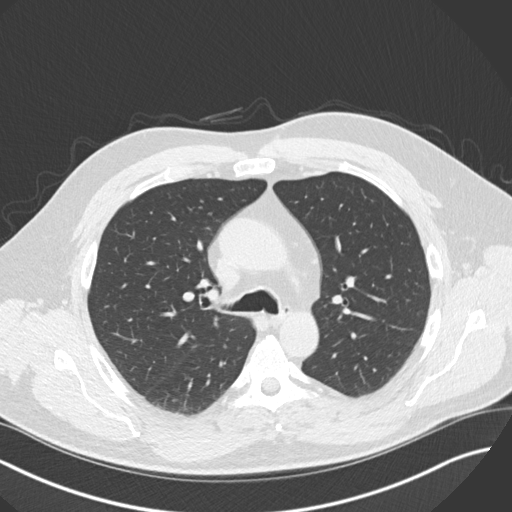
[im 110/147  mediastinal]
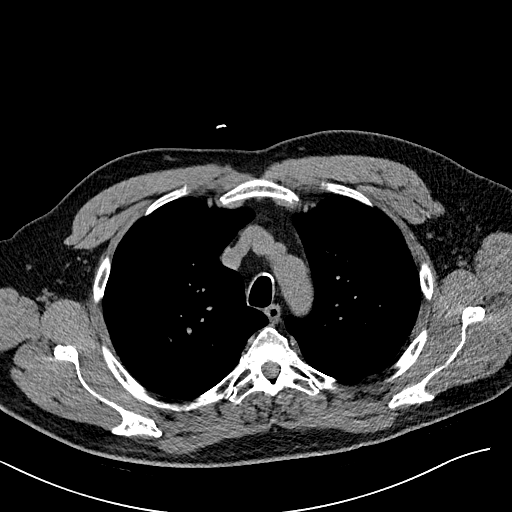
[im 110/147  lung]
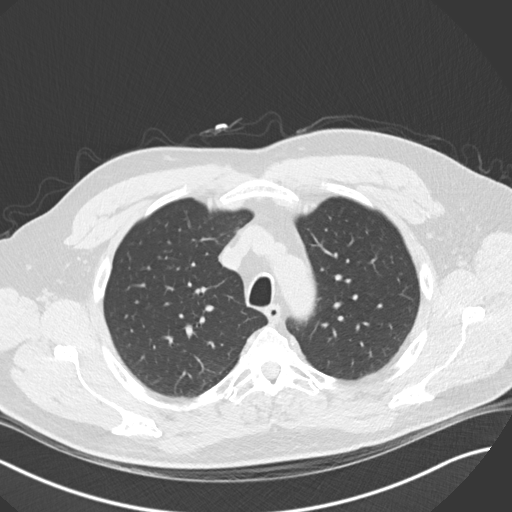
[im 125/147  lung]
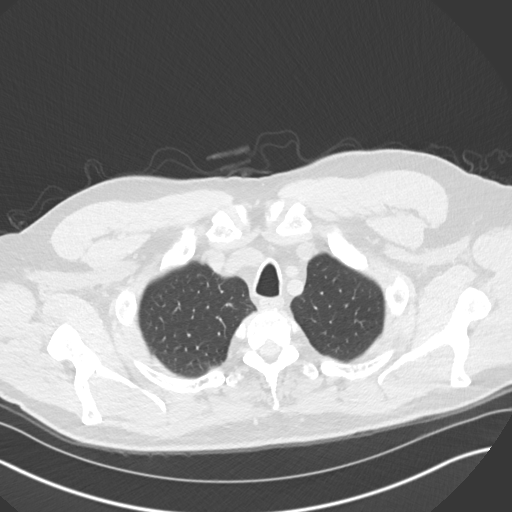
[im 139/147  lung]
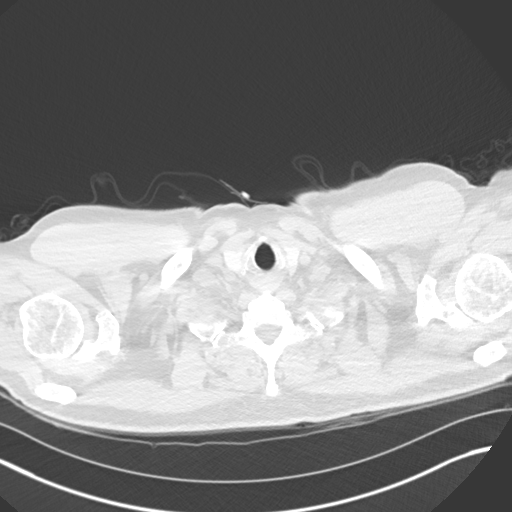

[Series 10: coronal · coronal · 0.59mm/px · 3 of 141 slices shown]
[im 29/141  lung]
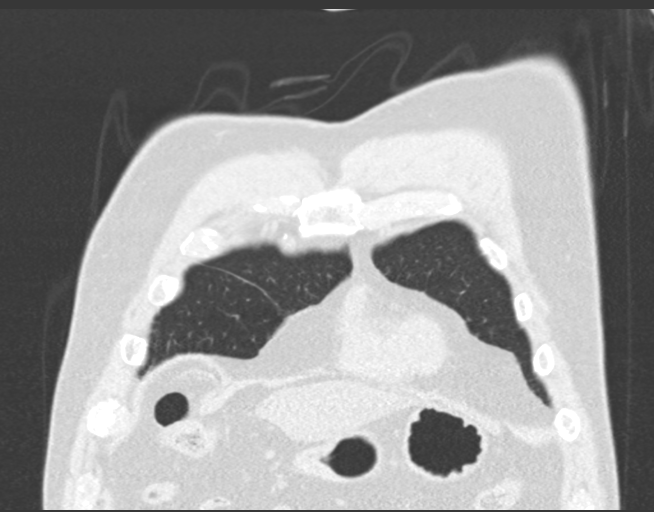
[im 57/141  lung]
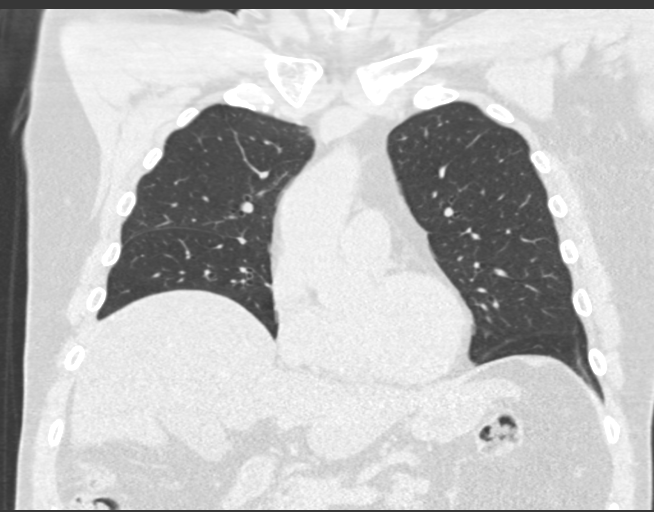
[im 85/141  lung]
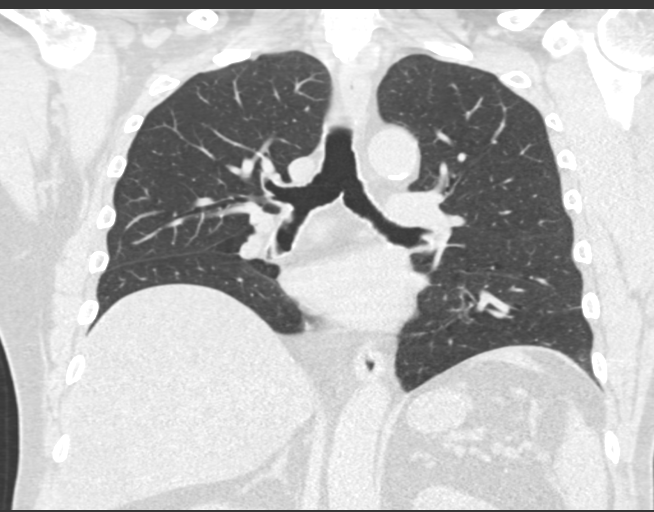

[14 of 36 positions shown; findings below may reference images not displayed]

FINDINGS: Cardiovascular: Heart size is normal. There is no significant
pericardial fluid, thickening or pericardial calcification. There is
aortic atherosclerosis, as well as atherosclerosis of the great
vessels of the mediastinum and the coronary arteries, including
calcified atherosclerotic plaque in the left main coronary artery.

Mediastinum/Nodes: No pathologically enlarged mediastinal or hilar
lymph nodes. Please note that accurate exclusion of hilar adenopathy
is limited on noncontrast CT scans. Several small densely calcified
mediastinal lymph nodes are incidentally noted. Esophagus is
unremarkable in appearance. No axillary lymphadenopathy.

Lungs/Pleura: High-resolution images demonstrate no significant
ground-glass attenuation, subpleural reticulation, parenchymal
banding, traction bronchiectasis or frank honeycombing. Inspiratory
and expiratory imaging demonstrates some mild air trapping,
indicative of mild small airways disease. There is no acute
consolidative airspace disease. No pleural effusions. No suspicious
appearing pulmonary nodules or masses. Mild linear scarring in the
inferior segment of the lingula and lateral aspect of the left lower
lobe.

Upper Abdomen: Diffuse low attenuation throughout the visualized
hepatic parenchyma, compatible with severe hepatic steatosis.

Musculoskeletal: There are no aggressive appearing lytic or blastic
lesions noted in the visualized portions of the skeleton.
IMPRESSION: 1. No evidence of interstitial lung disease.
2. No classic stigmata suggestive of sarcoidosis.
3. Aortic atherosclerosis, in addition to left main coronary artery
disease. Please note that although the presence of coronary artery
calcium documents the presence of coronary artery disease, the
severity of this disease and any potential stenosis cannot be
assessed on this non-gated CT examination. Assessment for potential
risk factor modification, dietary therapy or pharmacologic therapy
may be warranted, if clinically indicated.
4. Severe hepatic steatosis.

## 2017-03-07 DIAGNOSIS — M25511 Pain in right shoulder: Secondary | ICD-10-CM | POA: Diagnosis not present

## 2017-03-07 DIAGNOSIS — M6281 Muscle weakness (generalized): Secondary | ICD-10-CM | POA: Diagnosis not present

## 2017-03-07 DIAGNOSIS — M75112 Incomplete rotator cuff tear or rupture of left shoulder, not specified as traumatic: Secondary | ICD-10-CM | POA: Diagnosis not present

## 2017-03-07 DIAGNOSIS — M25611 Stiffness of right shoulder, not elsewhere classified: Secondary | ICD-10-CM | POA: Diagnosis not present

## 2017-03-10 DIAGNOSIS — M25511 Pain in right shoulder: Secondary | ICD-10-CM | POA: Diagnosis not present

## 2017-03-10 DIAGNOSIS — M6281 Muscle weakness (generalized): Secondary | ICD-10-CM | POA: Diagnosis not present

## 2017-03-10 DIAGNOSIS — M75112 Incomplete rotator cuff tear or rupture of left shoulder, not specified as traumatic: Secondary | ICD-10-CM | POA: Diagnosis not present

## 2017-03-10 DIAGNOSIS — M25611 Stiffness of right shoulder, not elsewhere classified: Secondary | ICD-10-CM | POA: Diagnosis not present

## 2017-03-12 DIAGNOSIS — M25611 Stiffness of right shoulder, not elsewhere classified: Secondary | ICD-10-CM | POA: Diagnosis not present

## 2017-03-12 DIAGNOSIS — M6281 Muscle weakness (generalized): Secondary | ICD-10-CM | POA: Diagnosis not present

## 2017-03-12 DIAGNOSIS — M25511 Pain in right shoulder: Secondary | ICD-10-CM | POA: Diagnosis not present

## 2017-03-12 DIAGNOSIS — M75112 Incomplete rotator cuff tear or rupture of left shoulder, not specified as traumatic: Secondary | ICD-10-CM | POA: Diagnosis not present

## 2017-03-20 DIAGNOSIS — M25611 Stiffness of right shoulder, not elsewhere classified: Secondary | ICD-10-CM | POA: Diagnosis not present

## 2017-03-20 DIAGNOSIS — M25511 Pain in right shoulder: Secondary | ICD-10-CM | POA: Diagnosis not present

## 2017-03-20 DIAGNOSIS — M75112 Incomplete rotator cuff tear or rupture of left shoulder, not specified as traumatic: Secondary | ICD-10-CM | POA: Diagnosis not present

## 2017-03-20 DIAGNOSIS — M6281 Muscle weakness (generalized): Secondary | ICD-10-CM | POA: Diagnosis not present

## 2017-03-27 DIAGNOSIS — M25611 Stiffness of right shoulder, not elsewhere classified: Secondary | ICD-10-CM | POA: Diagnosis not present

## 2017-03-27 DIAGNOSIS — M6281 Muscle weakness (generalized): Secondary | ICD-10-CM | POA: Diagnosis not present

## 2017-03-27 DIAGNOSIS — M25511 Pain in right shoulder: Secondary | ICD-10-CM | POA: Diagnosis not present

## 2017-03-27 DIAGNOSIS — M75112 Incomplete rotator cuff tear or rupture of left shoulder, not specified as traumatic: Secondary | ICD-10-CM | POA: Diagnosis not present

## 2017-04-03 DIAGNOSIS — M25611 Stiffness of right shoulder, not elsewhere classified: Secondary | ICD-10-CM | POA: Diagnosis not present

## 2017-04-03 DIAGNOSIS — M75112 Incomplete rotator cuff tear or rupture of left shoulder, not specified as traumatic: Secondary | ICD-10-CM | POA: Diagnosis not present

## 2017-04-03 DIAGNOSIS — M6281 Muscle weakness (generalized): Secondary | ICD-10-CM | POA: Diagnosis not present

## 2017-04-03 DIAGNOSIS — M25511 Pain in right shoulder: Secondary | ICD-10-CM | POA: Diagnosis not present

## 2017-04-10 DIAGNOSIS — M6281 Muscle weakness (generalized): Secondary | ICD-10-CM | POA: Diagnosis not present

## 2017-04-10 DIAGNOSIS — M75112 Incomplete rotator cuff tear or rupture of left shoulder, not specified as traumatic: Secondary | ICD-10-CM | POA: Diagnosis not present

## 2017-04-10 DIAGNOSIS — M25611 Stiffness of right shoulder, not elsewhere classified: Secondary | ICD-10-CM | POA: Diagnosis not present

## 2017-04-10 DIAGNOSIS — M25511 Pain in right shoulder: Secondary | ICD-10-CM | POA: Diagnosis not present

## 2017-04-16 DIAGNOSIS — M25611 Stiffness of right shoulder, not elsewhere classified: Secondary | ICD-10-CM | POA: Diagnosis not present

## 2017-04-16 DIAGNOSIS — M25511 Pain in right shoulder: Secondary | ICD-10-CM | POA: Diagnosis not present

## 2017-04-16 DIAGNOSIS — M75112 Incomplete rotator cuff tear or rupture of left shoulder, not specified as traumatic: Secondary | ICD-10-CM | POA: Diagnosis not present

## 2017-04-16 DIAGNOSIS — M6281 Muscle weakness (generalized): Secondary | ICD-10-CM | POA: Diagnosis not present

## 2017-04-25 DIAGNOSIS — M25611 Stiffness of right shoulder, not elsewhere classified: Secondary | ICD-10-CM | POA: Diagnosis not present

## 2017-04-25 DIAGNOSIS — M25511 Pain in right shoulder: Secondary | ICD-10-CM | POA: Diagnosis not present

## 2017-04-25 DIAGNOSIS — M6281 Muscle weakness (generalized): Secondary | ICD-10-CM | POA: Diagnosis not present

## 2017-04-25 DIAGNOSIS — M75112 Incomplete rotator cuff tear or rupture of left shoulder, not specified as traumatic: Secondary | ICD-10-CM | POA: Diagnosis not present

## 2017-04-30 DIAGNOSIS — M25511 Pain in right shoulder: Secondary | ICD-10-CM | POA: Diagnosis not present

## 2017-04-30 DIAGNOSIS — M75112 Incomplete rotator cuff tear or rupture of left shoulder, not specified as traumatic: Secondary | ICD-10-CM | POA: Diagnosis not present

## 2017-04-30 DIAGNOSIS — M6281 Muscle weakness (generalized): Secondary | ICD-10-CM | POA: Diagnosis not present

## 2017-04-30 DIAGNOSIS — M25611 Stiffness of right shoulder, not elsewhere classified: Secondary | ICD-10-CM | POA: Diagnosis not present

## 2017-05-08 DIAGNOSIS — Z79899 Other long term (current) drug therapy: Secondary | ICD-10-CM | POA: Diagnosis not present

## 2017-05-08 DIAGNOSIS — G2581 Restless legs syndrome: Secondary | ICD-10-CM | POA: Diagnosis not present

## 2017-05-08 DIAGNOSIS — M818 Other osteoporosis without current pathological fracture: Secondary | ICD-10-CM | POA: Diagnosis not present

## 2017-05-08 DIAGNOSIS — Z0000 Encounter for general adult medical examination without abnormal findings: Secondary | ICD-10-CM | POA: Diagnosis not present

## 2017-05-08 DIAGNOSIS — E063 Autoimmune thyroiditis: Secondary | ICD-10-CM | POA: Diagnosis not present

## 2017-05-08 DIAGNOSIS — Z6831 Body mass index (BMI) 31.0-31.9, adult: Secondary | ICD-10-CM | POA: Diagnosis not present

## 2017-05-08 DIAGNOSIS — R739 Hyperglycemia, unspecified: Secondary | ICD-10-CM | POA: Diagnosis not present

## 2017-05-08 DIAGNOSIS — E669 Obesity, unspecified: Secondary | ICD-10-CM | POA: Diagnosis not present

## 2017-05-08 DIAGNOSIS — Z125 Encounter for screening for malignant neoplasm of prostate: Secondary | ICD-10-CM | POA: Diagnosis not present

## 2017-05-08 DIAGNOSIS — Z23 Encounter for immunization: Secondary | ICD-10-CM | POA: Diagnosis not present

## 2017-05-08 DIAGNOSIS — I2510 Atherosclerotic heart disease of native coronary artery without angina pectoris: Secondary | ICD-10-CM | POA: Diagnosis not present

## 2017-05-08 DIAGNOSIS — N400 Enlarged prostate without lower urinary tract symptoms: Secondary | ICD-10-CM | POA: Diagnosis not present

## 2017-05-08 DIAGNOSIS — E785 Hyperlipidemia, unspecified: Secondary | ICD-10-CM | POA: Diagnosis not present

## 2017-05-15 DIAGNOSIS — M6281 Muscle weakness (generalized): Secondary | ICD-10-CM | POA: Diagnosis not present

## 2017-05-15 DIAGNOSIS — M25611 Stiffness of right shoulder, not elsewhere classified: Secondary | ICD-10-CM | POA: Diagnosis not present

## 2017-05-15 DIAGNOSIS — M75112 Incomplete rotator cuff tear or rupture of left shoulder, not specified as traumatic: Secondary | ICD-10-CM | POA: Diagnosis not present

## 2017-05-15 DIAGNOSIS — M25511 Pain in right shoulder: Secondary | ICD-10-CM | POA: Diagnosis not present

## 2017-05-21 DIAGNOSIS — M25511 Pain in right shoulder: Secondary | ICD-10-CM | POA: Diagnosis not present

## 2017-05-21 DIAGNOSIS — M6281 Muscle weakness (generalized): Secondary | ICD-10-CM | POA: Diagnosis not present

## 2017-05-21 DIAGNOSIS — M75112 Incomplete rotator cuff tear or rupture of left shoulder, not specified as traumatic: Secondary | ICD-10-CM | POA: Diagnosis not present

## 2017-05-21 DIAGNOSIS — M25611 Stiffness of right shoulder, not elsewhere classified: Secondary | ICD-10-CM | POA: Diagnosis not present

## 2017-05-28 DIAGNOSIS — M75112 Incomplete rotator cuff tear or rupture of left shoulder, not specified as traumatic: Secondary | ICD-10-CM | POA: Diagnosis not present

## 2017-05-28 DIAGNOSIS — M25511 Pain in right shoulder: Secondary | ICD-10-CM | POA: Diagnosis not present

## 2017-05-28 DIAGNOSIS — M25611 Stiffness of right shoulder, not elsewhere classified: Secondary | ICD-10-CM | POA: Diagnosis not present

## 2017-05-28 DIAGNOSIS — M6281 Muscle weakness (generalized): Secondary | ICD-10-CM | POA: Diagnosis not present

## 2017-06-18 DIAGNOSIS — M25511 Pain in right shoulder: Secondary | ICD-10-CM | POA: Diagnosis not present

## 2017-06-18 DIAGNOSIS — M75112 Incomplete rotator cuff tear or rupture of left shoulder, not specified as traumatic: Secondary | ICD-10-CM | POA: Diagnosis not present

## 2017-06-18 DIAGNOSIS — M25611 Stiffness of right shoulder, not elsewhere classified: Secondary | ICD-10-CM | POA: Diagnosis not present

## 2017-06-18 DIAGNOSIS — M6281 Muscle weakness (generalized): Secondary | ICD-10-CM | POA: Diagnosis not present

## 2017-06-26 DIAGNOSIS — M25611 Stiffness of right shoulder, not elsewhere classified: Secondary | ICD-10-CM | POA: Diagnosis not present

## 2017-06-26 DIAGNOSIS — M75112 Incomplete rotator cuff tear or rupture of left shoulder, not specified as traumatic: Secondary | ICD-10-CM | POA: Diagnosis not present

## 2017-06-26 DIAGNOSIS — M25511 Pain in right shoulder: Secondary | ICD-10-CM | POA: Diagnosis not present

## 2017-06-26 DIAGNOSIS — M6281 Muscle weakness (generalized): Secondary | ICD-10-CM | POA: Diagnosis not present

## 2017-07-10 DIAGNOSIS — M75112 Incomplete rotator cuff tear or rupture of left shoulder, not specified as traumatic: Secondary | ICD-10-CM | POA: Diagnosis not present

## 2017-07-10 DIAGNOSIS — M25611 Stiffness of right shoulder, not elsewhere classified: Secondary | ICD-10-CM | POA: Diagnosis not present

## 2017-07-10 DIAGNOSIS — M6281 Muscle weakness (generalized): Secondary | ICD-10-CM | POA: Diagnosis not present

## 2017-07-10 DIAGNOSIS — M25511 Pain in right shoulder: Secondary | ICD-10-CM | POA: Diagnosis not present

## 2017-07-22 DIAGNOSIS — M75112 Incomplete rotator cuff tear or rupture of left shoulder, not specified as traumatic: Secondary | ICD-10-CM | POA: Diagnosis not present

## 2017-07-24 DIAGNOSIS — M6281 Muscle weakness (generalized): Secondary | ICD-10-CM | POA: Diagnosis not present

## 2017-07-24 DIAGNOSIS — M75112 Incomplete rotator cuff tear or rupture of left shoulder, not specified as traumatic: Secondary | ICD-10-CM | POA: Diagnosis not present

## 2017-07-24 DIAGNOSIS — M25511 Pain in right shoulder: Secondary | ICD-10-CM | POA: Diagnosis not present

## 2017-07-24 DIAGNOSIS — M25611 Stiffness of right shoulder, not elsewhere classified: Secondary | ICD-10-CM | POA: Diagnosis not present

## 2017-08-13 DIAGNOSIS — E785 Hyperlipidemia, unspecified: Secondary | ICD-10-CM | POA: Diagnosis not present

## 2017-08-13 DIAGNOSIS — Z6832 Body mass index (BMI) 32.0-32.9, adult: Secondary | ICD-10-CM | POA: Diagnosis not present

## 2017-08-13 DIAGNOSIS — E063 Autoimmune thyroiditis: Secondary | ICD-10-CM | POA: Diagnosis not present

## 2017-08-13 DIAGNOSIS — I2510 Atherosclerotic heart disease of native coronary artery without angina pectoris: Secondary | ICD-10-CM | POA: Diagnosis not present

## 2017-08-13 DIAGNOSIS — R739 Hyperglycemia, unspecified: Secondary | ICD-10-CM | POA: Diagnosis not present

## 2017-08-13 DIAGNOSIS — R05 Cough: Secondary | ICD-10-CM | POA: Diagnosis not present

## 2017-08-13 DIAGNOSIS — N400 Enlarged prostate without lower urinary tract symptoms: Secondary | ICD-10-CM | POA: Diagnosis not present

## 2017-08-13 DIAGNOSIS — G2581 Restless legs syndrome: Secondary | ICD-10-CM | POA: Diagnosis not present

## 2017-08-22 DIAGNOSIS — M25611 Stiffness of right shoulder, not elsewhere classified: Secondary | ICD-10-CM | POA: Diagnosis not present

## 2017-08-22 DIAGNOSIS — M75112 Incomplete rotator cuff tear or rupture of left shoulder, not specified as traumatic: Secondary | ICD-10-CM | POA: Diagnosis not present

## 2017-08-22 DIAGNOSIS — M25511 Pain in right shoulder: Secondary | ICD-10-CM | POA: Diagnosis not present

## 2017-08-22 DIAGNOSIS — M6281 Muscle weakness (generalized): Secondary | ICD-10-CM | POA: Diagnosis not present

## 2017-09-17 DIAGNOSIS — Z1331 Encounter for screening for depression: Secondary | ICD-10-CM | POA: Diagnosis not present

## 2017-09-17 DIAGNOSIS — Z9181 History of falling: Secondary | ICD-10-CM | POA: Diagnosis not present

## 2017-09-17 DIAGNOSIS — J189 Pneumonia, unspecified organism: Secondary | ICD-10-CM | POA: Diagnosis not present

## 2017-09-17 DIAGNOSIS — Z1339 Encounter for screening examination for other mental health and behavioral disorders: Secondary | ICD-10-CM | POA: Diagnosis not present

## 2017-09-17 DIAGNOSIS — J301 Allergic rhinitis due to pollen: Secondary | ICD-10-CM | POA: Diagnosis not present

## 2017-09-17 DIAGNOSIS — Z6831 Body mass index (BMI) 31.0-31.9, adult: Secondary | ICD-10-CM | POA: Diagnosis not present

## 2017-09-17 DIAGNOSIS — E118 Type 2 diabetes mellitus with unspecified complications: Secondary | ICD-10-CM | POA: Diagnosis not present

## 2017-09-17 DIAGNOSIS — G2581 Restless legs syndrome: Secondary | ICD-10-CM | POA: Diagnosis not present

## 2017-09-24 DIAGNOSIS — R69 Illness, unspecified: Secondary | ICD-10-CM | POA: Diagnosis not present

## 2017-10-21 DIAGNOSIS — M75112 Incomplete rotator cuff tear or rupture of left shoulder, not specified as traumatic: Secondary | ICD-10-CM | POA: Diagnosis not present

## 2017-11-05 DIAGNOSIS — M1711 Unilateral primary osteoarthritis, right knee: Secondary | ICD-10-CM | POA: Diagnosis not present

## 2017-11-06 DIAGNOSIS — E039 Hypothyroidism, unspecified: Secondary | ICD-10-CM | POA: Diagnosis not present

## 2017-11-06 DIAGNOSIS — Z7982 Long term (current) use of aspirin: Secondary | ICD-10-CM | POA: Diagnosis not present

## 2017-11-06 DIAGNOSIS — G2581 Restless legs syndrome: Secondary | ICD-10-CM | POA: Diagnosis not present

## 2017-11-06 DIAGNOSIS — N529 Male erectile dysfunction, unspecified: Secondary | ICD-10-CM | POA: Diagnosis not present

## 2017-11-06 DIAGNOSIS — E119 Type 2 diabetes mellitus without complications: Secondary | ICD-10-CM | POA: Diagnosis not present

## 2017-11-06 DIAGNOSIS — E669 Obesity, unspecified: Secondary | ICD-10-CM | POA: Diagnosis not present

## 2017-11-06 DIAGNOSIS — E785 Hyperlipidemia, unspecified: Secondary | ICD-10-CM | POA: Diagnosis not present

## 2017-11-06 DIAGNOSIS — M1990 Unspecified osteoarthritis, unspecified site: Secondary | ICD-10-CM | POA: Diagnosis not present

## 2017-11-06 DIAGNOSIS — N400 Enlarged prostate without lower urinary tract symptoms: Secondary | ICD-10-CM | POA: Diagnosis not present

## 2017-11-06 DIAGNOSIS — Z6830 Body mass index (BMI) 30.0-30.9, adult: Secondary | ICD-10-CM | POA: Diagnosis not present

## 2017-11-13 DIAGNOSIS — E663 Overweight: Secondary | ICD-10-CM | POA: Diagnosis not present

## 2017-11-13 DIAGNOSIS — Z6828 Body mass index (BMI) 28.0-28.9, adult: Secondary | ICD-10-CM | POA: Diagnosis not present

## 2017-11-13 DIAGNOSIS — E118 Type 2 diabetes mellitus with unspecified complications: Secondary | ICD-10-CM | POA: Diagnosis not present

## 2017-11-13 DIAGNOSIS — N400 Enlarged prostate without lower urinary tract symptoms: Secondary | ICD-10-CM | POA: Diagnosis not present

## 2017-11-13 DIAGNOSIS — E063 Autoimmune thyroiditis: Secondary | ICD-10-CM | POA: Diagnosis not present

## 2017-11-13 DIAGNOSIS — E785 Hyperlipidemia, unspecified: Secondary | ICD-10-CM | POA: Diagnosis not present

## 2017-12-30 DIAGNOSIS — R69 Illness, unspecified: Secondary | ICD-10-CM | POA: Diagnosis not present

## 2018-01-05 DIAGNOSIS — R69 Illness, unspecified: Secondary | ICD-10-CM | POA: Diagnosis not present

## 2018-01-20 DIAGNOSIS — R69 Illness, unspecified: Secondary | ICD-10-CM | POA: Diagnosis not present

## 2018-01-21 DIAGNOSIS — R69 Illness, unspecified: Secondary | ICD-10-CM | POA: Diagnosis not present

## 2018-02-25 DIAGNOSIS — R69 Illness, unspecified: Secondary | ICD-10-CM | POA: Diagnosis not present

## 2018-02-26 DIAGNOSIS — R69 Illness, unspecified: Secondary | ICD-10-CM | POA: Diagnosis not present

## 2018-03-04 DIAGNOSIS — E118 Type 2 diabetes mellitus with unspecified complications: Secondary | ICD-10-CM | POA: Diagnosis not present

## 2018-03-04 DIAGNOSIS — E785 Hyperlipidemia, unspecified: Secondary | ICD-10-CM | POA: Diagnosis not present

## 2018-03-04 DIAGNOSIS — Z6830 Body mass index (BMI) 30.0-30.9, adult: Secondary | ICD-10-CM | POA: Diagnosis not present

## 2018-03-04 DIAGNOSIS — G2581 Restless legs syndrome: Secondary | ICD-10-CM | POA: Diagnosis not present

## 2018-03-04 DIAGNOSIS — N529 Male erectile dysfunction, unspecified: Secondary | ICD-10-CM | POA: Diagnosis not present

## 2018-03-04 DIAGNOSIS — E063 Autoimmune thyroiditis: Secondary | ICD-10-CM | POA: Diagnosis not present

## 2018-03-10 DIAGNOSIS — Z9889 Other specified postprocedural states: Secondary | ICD-10-CM | POA: Diagnosis not present

## 2018-03-16 DIAGNOSIS — M25511 Pain in right shoulder: Secondary | ICD-10-CM | POA: Diagnosis not present

## 2018-03-30 DIAGNOSIS — Z9889 Other specified postprocedural states: Secondary | ICD-10-CM | POA: Diagnosis not present

## 2018-04-01 DIAGNOSIS — M1711 Unilateral primary osteoarthritis, right knee: Secondary | ICD-10-CM | POA: Diagnosis not present

## 2018-05-28 DIAGNOSIS — K648 Other hemorrhoids: Secondary | ICD-10-CM | POA: Diagnosis not present

## 2018-05-28 DIAGNOSIS — K5730 Diverticulosis of large intestine without perforation or abscess without bleeding: Secondary | ICD-10-CM | POA: Diagnosis not present

## 2018-06-01 DIAGNOSIS — R69 Illness, unspecified: Secondary | ICD-10-CM | POA: Diagnosis not present

## 2018-06-09 DIAGNOSIS — I2510 Atherosclerotic heart disease of native coronary artery without angina pectoris: Secondary | ICD-10-CM | POA: Diagnosis not present

## 2018-06-09 DIAGNOSIS — Z23 Encounter for immunization: Secondary | ICD-10-CM | POA: Diagnosis not present

## 2018-06-09 DIAGNOSIS — E785 Hyperlipidemia, unspecified: Secondary | ICD-10-CM | POA: Diagnosis not present

## 2018-06-09 DIAGNOSIS — E063 Autoimmune thyroiditis: Secondary | ICD-10-CM | POA: Diagnosis not present

## 2018-06-09 DIAGNOSIS — E118 Type 2 diabetes mellitus with unspecified complications: Secondary | ICD-10-CM | POA: Diagnosis not present

## 2018-06-09 DIAGNOSIS — N529 Male erectile dysfunction, unspecified: Secondary | ICD-10-CM | POA: Diagnosis not present

## 2018-06-09 DIAGNOSIS — Z6831 Body mass index (BMI) 31.0-31.9, adult: Secondary | ICD-10-CM | POA: Diagnosis not present

## 2018-06-09 DIAGNOSIS — G2581 Restless legs syndrome: Secondary | ICD-10-CM | POA: Diagnosis not present

## 2018-06-09 DIAGNOSIS — Z125 Encounter for screening for malignant neoplasm of prostate: Secondary | ICD-10-CM | POA: Diagnosis not present

## 2018-06-09 DIAGNOSIS — Z0000 Encounter for general adult medical examination without abnormal findings: Secondary | ICD-10-CM | POA: Diagnosis not present

## 2018-07-06 DIAGNOSIS — G4733 Obstructive sleep apnea (adult) (pediatric): Secondary | ICD-10-CM | POA: Diagnosis not present

## 2018-08-05 DIAGNOSIS — G4733 Obstructive sleep apnea (adult) (pediatric): Secondary | ICD-10-CM | POA: Diagnosis not present

## 2018-08-18 DIAGNOSIS — E118 Type 2 diabetes mellitus with unspecified complications: Secondary | ICD-10-CM | POA: Diagnosis not present

## 2018-08-18 DIAGNOSIS — Z01818 Encounter for other preprocedural examination: Secondary | ICD-10-CM | POA: Diagnosis not present

## 2018-08-18 DIAGNOSIS — E063 Autoimmune thyroiditis: Secondary | ICD-10-CM | POA: Diagnosis not present

## 2018-08-18 DIAGNOSIS — I2510 Atherosclerotic heart disease of native coronary artery without angina pectoris: Secondary | ICD-10-CM | POA: Diagnosis not present

## 2018-08-18 DIAGNOSIS — E785 Hyperlipidemia, unspecified: Secondary | ICD-10-CM | POA: Diagnosis not present

## 2018-08-21 DIAGNOSIS — D126 Benign neoplasm of colon, unspecified: Secondary | ICD-10-CM | POA: Diagnosis not present

## 2018-08-21 DIAGNOSIS — D122 Benign neoplasm of ascending colon: Secondary | ICD-10-CM | POA: Diagnosis not present

## 2018-08-21 DIAGNOSIS — Z79899 Other long term (current) drug therapy: Secondary | ICD-10-CM | POA: Diagnosis not present

## 2018-08-21 DIAGNOSIS — K648 Other hemorrhoids: Secondary | ICD-10-CM | POA: Diagnosis not present

## 2018-08-21 DIAGNOSIS — K5730 Diverticulosis of large intestine without perforation or abscess without bleeding: Secondary | ICD-10-CM | POA: Diagnosis not present

## 2018-08-21 DIAGNOSIS — K635 Polyp of colon: Secondary | ICD-10-CM | POA: Diagnosis not present

## 2018-08-21 DIAGNOSIS — Z7982 Long term (current) use of aspirin: Secondary | ICD-10-CM | POA: Diagnosis not present

## 2018-08-25 DIAGNOSIS — E559 Vitamin D deficiency, unspecified: Secondary | ICD-10-CM | POA: Diagnosis not present

## 2018-08-25 DIAGNOSIS — Z79899 Other long term (current) drug therapy: Secondary | ICD-10-CM | POA: Diagnosis not present

## 2018-08-25 DIAGNOSIS — J9811 Atelectasis: Secondary | ICD-10-CM | POA: Diagnosis not present

## 2018-08-25 DIAGNOSIS — M79609 Pain in unspecified limb: Secondary | ICD-10-CM | POA: Diagnosis not present

## 2018-08-25 DIAGNOSIS — Z01818 Encounter for other preprocedural examination: Secondary | ICD-10-CM | POA: Diagnosis not present

## 2018-09-05 DIAGNOSIS — G4733 Obstructive sleep apnea (adult) (pediatric): Secondary | ICD-10-CM | POA: Diagnosis not present

## 2018-09-07 DIAGNOSIS — Z01818 Encounter for other preprocedural examination: Secondary | ICD-10-CM | POA: Diagnosis not present

## 2018-09-07 DIAGNOSIS — M1711 Unilateral primary osteoarthritis, right knee: Secondary | ICD-10-CM | POA: Diagnosis not present

## 2018-09-14 DIAGNOSIS — I2584 Coronary atherosclerosis due to calcified coronary lesion: Secondary | ICD-10-CM | POA: Diagnosis not present

## 2018-09-14 DIAGNOSIS — E118 Type 2 diabetes mellitus with unspecified complications: Secondary | ICD-10-CM | POA: Diagnosis not present

## 2018-09-14 DIAGNOSIS — G4730 Sleep apnea, unspecified: Secondary | ICD-10-CM | POA: Diagnosis not present

## 2018-09-14 DIAGNOSIS — G2581 Restless legs syndrome: Secondary | ICD-10-CM | POA: Diagnosis not present

## 2018-09-14 DIAGNOSIS — E785 Hyperlipidemia, unspecified: Secondary | ICD-10-CM | POA: Diagnosis not present

## 2018-09-22 DIAGNOSIS — E785 Hyperlipidemia, unspecified: Secondary | ICD-10-CM | POA: Diagnosis not present

## 2018-09-22 DIAGNOSIS — Z471 Aftercare following joint replacement surgery: Secondary | ICD-10-CM | POA: Diagnosis not present

## 2018-09-22 DIAGNOSIS — M1711 Unilateral primary osteoarthritis, right knee: Secondary | ICD-10-CM | POA: Diagnosis not present

## 2018-09-22 DIAGNOSIS — Z96651 Presence of right artificial knee joint: Secondary | ICD-10-CM | POA: Diagnosis not present

## 2018-09-22 DIAGNOSIS — E119 Type 2 diabetes mellitus without complications: Secondary | ICD-10-CM | POA: Diagnosis not present

## 2018-09-22 DIAGNOSIS — G4733 Obstructive sleep apnea (adult) (pediatric): Secondary | ICD-10-CM | POA: Diagnosis not present

## 2018-09-22 DIAGNOSIS — Z79899 Other long term (current) drug therapy: Secondary | ICD-10-CM | POA: Diagnosis not present

## 2018-09-22 DIAGNOSIS — E039 Hypothyroidism, unspecified: Secondary | ICD-10-CM | POA: Diagnosis not present

## 2018-09-22 DIAGNOSIS — M1611 Unilateral primary osteoarthritis, right hip: Secondary | ICD-10-CM | POA: Diagnosis not present

## 2018-09-22 DIAGNOSIS — M545 Low back pain: Secondary | ICD-10-CM | POA: Diagnosis not present

## 2018-09-22 DIAGNOSIS — Z7982 Long term (current) use of aspirin: Secondary | ICD-10-CM | POA: Diagnosis not present

## 2018-09-22 DIAGNOSIS — I10 Essential (primary) hypertension: Secondary | ICD-10-CM | POA: Diagnosis not present

## 2018-09-24 DIAGNOSIS — Z471 Aftercare following joint replacement surgery: Secondary | ICD-10-CM | POA: Diagnosis not present

## 2018-09-24 DIAGNOSIS — M549 Dorsalgia, unspecified: Secondary | ICD-10-CM | POA: Diagnosis not present

## 2018-09-24 DIAGNOSIS — E039 Hypothyroidism, unspecified: Secondary | ICD-10-CM | POA: Diagnosis not present

## 2018-09-24 DIAGNOSIS — Z9181 History of falling: Secondary | ICD-10-CM | POA: Diagnosis not present

## 2018-09-24 DIAGNOSIS — E119 Type 2 diabetes mellitus without complications: Secondary | ICD-10-CM | POA: Diagnosis not present

## 2018-09-24 DIAGNOSIS — M1991 Primary osteoarthritis, unspecified site: Secondary | ICD-10-CM | POA: Diagnosis not present

## 2018-09-24 DIAGNOSIS — Z96651 Presence of right artificial knee joint: Secondary | ICD-10-CM | POA: Diagnosis not present

## 2018-09-24 DIAGNOSIS — Z7982 Long term (current) use of aspirin: Secondary | ICD-10-CM | POA: Diagnosis not present

## 2018-09-24 DIAGNOSIS — G8918 Other acute postprocedural pain: Secondary | ICD-10-CM | POA: Diagnosis not present

## 2018-09-24 DIAGNOSIS — E785 Hyperlipidemia, unspecified: Secondary | ICD-10-CM | POA: Diagnosis not present

## 2018-09-24 DIAGNOSIS — G4733 Obstructive sleep apnea (adult) (pediatric): Secondary | ICD-10-CM | POA: Diagnosis not present

## 2018-09-24 DIAGNOSIS — Z7984 Long term (current) use of oral hypoglycemic drugs: Secondary | ICD-10-CM | POA: Diagnosis not present

## 2018-09-25 DIAGNOSIS — E039 Hypothyroidism, unspecified: Secondary | ICD-10-CM | POA: Diagnosis not present

## 2018-09-25 DIAGNOSIS — G4733 Obstructive sleep apnea (adult) (pediatric): Secondary | ICD-10-CM | POA: Diagnosis not present

## 2018-09-25 DIAGNOSIS — M549 Dorsalgia, unspecified: Secondary | ICD-10-CM | POA: Diagnosis not present

## 2018-09-25 DIAGNOSIS — Z7982 Long term (current) use of aspirin: Secondary | ICD-10-CM | POA: Diagnosis not present

## 2018-09-25 DIAGNOSIS — Z471 Aftercare following joint replacement surgery: Secondary | ICD-10-CM | POA: Diagnosis not present

## 2018-09-25 DIAGNOSIS — E119 Type 2 diabetes mellitus without complications: Secondary | ICD-10-CM | POA: Diagnosis not present

## 2018-09-25 DIAGNOSIS — Z96651 Presence of right artificial knee joint: Secondary | ICD-10-CM | POA: Diagnosis not present

## 2018-09-25 DIAGNOSIS — G8918 Other acute postprocedural pain: Secondary | ICD-10-CM | POA: Diagnosis not present

## 2018-09-25 DIAGNOSIS — M1991 Primary osteoarthritis, unspecified site: Secondary | ICD-10-CM | POA: Diagnosis not present

## 2018-09-25 DIAGNOSIS — Z7984 Long term (current) use of oral hypoglycemic drugs: Secondary | ICD-10-CM | POA: Diagnosis not present

## 2018-09-25 DIAGNOSIS — Z9181 History of falling: Secondary | ICD-10-CM | POA: Diagnosis not present

## 2018-09-25 DIAGNOSIS — E785 Hyperlipidemia, unspecified: Secondary | ICD-10-CM | POA: Diagnosis not present

## 2018-09-27 DIAGNOSIS — Z7982 Long term (current) use of aspirin: Secondary | ICD-10-CM | POA: Diagnosis not present

## 2018-09-27 DIAGNOSIS — M549 Dorsalgia, unspecified: Secondary | ICD-10-CM | POA: Diagnosis not present

## 2018-09-27 DIAGNOSIS — G4733 Obstructive sleep apnea (adult) (pediatric): Secondary | ICD-10-CM | POA: Diagnosis not present

## 2018-09-27 DIAGNOSIS — Z471 Aftercare following joint replacement surgery: Secondary | ICD-10-CM | POA: Diagnosis not present

## 2018-09-27 DIAGNOSIS — G8918 Other acute postprocedural pain: Secondary | ICD-10-CM | POA: Diagnosis not present

## 2018-09-27 DIAGNOSIS — E039 Hypothyroidism, unspecified: Secondary | ICD-10-CM | POA: Diagnosis not present

## 2018-09-27 DIAGNOSIS — Z9181 History of falling: Secondary | ICD-10-CM | POA: Diagnosis not present

## 2018-09-27 DIAGNOSIS — Z7984 Long term (current) use of oral hypoglycemic drugs: Secondary | ICD-10-CM | POA: Diagnosis not present

## 2018-09-27 DIAGNOSIS — M1991 Primary osteoarthritis, unspecified site: Secondary | ICD-10-CM | POA: Diagnosis not present

## 2018-09-27 DIAGNOSIS — Z96651 Presence of right artificial knee joint: Secondary | ICD-10-CM | POA: Diagnosis not present

## 2018-09-27 DIAGNOSIS — E119 Type 2 diabetes mellitus without complications: Secondary | ICD-10-CM | POA: Diagnosis not present

## 2018-09-27 DIAGNOSIS — E785 Hyperlipidemia, unspecified: Secondary | ICD-10-CM | POA: Diagnosis not present

## 2018-09-28 DIAGNOSIS — Z471 Aftercare following joint replacement surgery: Secondary | ICD-10-CM | POA: Diagnosis not present

## 2018-09-28 DIAGNOSIS — Z7984 Long term (current) use of oral hypoglycemic drugs: Secondary | ICD-10-CM | POA: Diagnosis not present

## 2018-09-28 DIAGNOSIS — M549 Dorsalgia, unspecified: Secondary | ICD-10-CM | POA: Diagnosis not present

## 2018-09-28 DIAGNOSIS — Z9181 History of falling: Secondary | ICD-10-CM | POA: Diagnosis not present

## 2018-09-28 DIAGNOSIS — E119 Type 2 diabetes mellitus without complications: Secondary | ICD-10-CM | POA: Diagnosis not present

## 2018-09-28 DIAGNOSIS — E785 Hyperlipidemia, unspecified: Secondary | ICD-10-CM | POA: Diagnosis not present

## 2018-09-28 DIAGNOSIS — G8918 Other acute postprocedural pain: Secondary | ICD-10-CM | POA: Diagnosis not present

## 2018-09-28 DIAGNOSIS — Z7982 Long term (current) use of aspirin: Secondary | ICD-10-CM | POA: Diagnosis not present

## 2018-09-28 DIAGNOSIS — G4733 Obstructive sleep apnea (adult) (pediatric): Secondary | ICD-10-CM | POA: Diagnosis not present

## 2018-09-28 DIAGNOSIS — E039 Hypothyroidism, unspecified: Secondary | ICD-10-CM | POA: Diagnosis not present

## 2018-09-28 DIAGNOSIS — M1991 Primary osteoarthritis, unspecified site: Secondary | ICD-10-CM | POA: Diagnosis not present

## 2018-09-28 DIAGNOSIS — Z96651 Presence of right artificial knee joint: Secondary | ICD-10-CM | POA: Diagnosis not present

## 2018-09-30 DIAGNOSIS — Z7982 Long term (current) use of aspirin: Secondary | ICD-10-CM | POA: Diagnosis not present

## 2018-09-30 DIAGNOSIS — E785 Hyperlipidemia, unspecified: Secondary | ICD-10-CM | POA: Diagnosis not present

## 2018-09-30 DIAGNOSIS — G8918 Other acute postprocedural pain: Secondary | ICD-10-CM | POA: Diagnosis not present

## 2018-09-30 DIAGNOSIS — Z7984 Long term (current) use of oral hypoglycemic drugs: Secondary | ICD-10-CM | POA: Diagnosis not present

## 2018-09-30 DIAGNOSIS — G4733 Obstructive sleep apnea (adult) (pediatric): Secondary | ICD-10-CM | POA: Diagnosis not present

## 2018-09-30 DIAGNOSIS — M549 Dorsalgia, unspecified: Secondary | ICD-10-CM | POA: Diagnosis not present

## 2018-09-30 DIAGNOSIS — E119 Type 2 diabetes mellitus without complications: Secondary | ICD-10-CM | POA: Diagnosis not present

## 2018-09-30 DIAGNOSIS — Z96651 Presence of right artificial knee joint: Secondary | ICD-10-CM | POA: Diagnosis not present

## 2018-09-30 DIAGNOSIS — Z471 Aftercare following joint replacement surgery: Secondary | ICD-10-CM | POA: Diagnosis not present

## 2018-09-30 DIAGNOSIS — E039 Hypothyroidism, unspecified: Secondary | ICD-10-CM | POA: Diagnosis not present

## 2018-09-30 DIAGNOSIS — Z9181 History of falling: Secondary | ICD-10-CM | POA: Diagnosis not present

## 2018-09-30 DIAGNOSIS — M1991 Primary osteoarthritis, unspecified site: Secondary | ICD-10-CM | POA: Diagnosis not present

## 2018-10-02 DIAGNOSIS — Z471 Aftercare following joint replacement surgery: Secondary | ICD-10-CM | POA: Diagnosis not present

## 2018-10-02 DIAGNOSIS — Z9181 History of falling: Secondary | ICD-10-CM | POA: Diagnosis not present

## 2018-10-02 DIAGNOSIS — E785 Hyperlipidemia, unspecified: Secondary | ICD-10-CM | POA: Diagnosis not present

## 2018-10-02 DIAGNOSIS — M1991 Primary osteoarthritis, unspecified site: Secondary | ICD-10-CM | POA: Diagnosis not present

## 2018-10-02 DIAGNOSIS — Z7982 Long term (current) use of aspirin: Secondary | ICD-10-CM | POA: Diagnosis not present

## 2018-10-02 DIAGNOSIS — E119 Type 2 diabetes mellitus without complications: Secondary | ICD-10-CM | POA: Diagnosis not present

## 2018-10-02 DIAGNOSIS — E039 Hypothyroidism, unspecified: Secondary | ICD-10-CM | POA: Diagnosis not present

## 2018-10-02 DIAGNOSIS — M549 Dorsalgia, unspecified: Secondary | ICD-10-CM | POA: Diagnosis not present

## 2018-10-02 DIAGNOSIS — Z7984 Long term (current) use of oral hypoglycemic drugs: Secondary | ICD-10-CM | POA: Diagnosis not present

## 2018-10-02 DIAGNOSIS — G8918 Other acute postprocedural pain: Secondary | ICD-10-CM | POA: Diagnosis not present

## 2018-10-02 DIAGNOSIS — Z96651 Presence of right artificial knee joint: Secondary | ICD-10-CM | POA: Diagnosis not present

## 2018-10-02 DIAGNOSIS — G4733 Obstructive sleep apnea (adult) (pediatric): Secondary | ICD-10-CM | POA: Diagnosis not present

## 2018-10-05 DIAGNOSIS — M549 Dorsalgia, unspecified: Secondary | ICD-10-CM | POA: Diagnosis not present

## 2018-10-05 DIAGNOSIS — Z9181 History of falling: Secondary | ICD-10-CM | POA: Diagnosis not present

## 2018-10-05 DIAGNOSIS — Z7982 Long term (current) use of aspirin: Secondary | ICD-10-CM | POA: Diagnosis not present

## 2018-10-05 DIAGNOSIS — E785 Hyperlipidemia, unspecified: Secondary | ICD-10-CM | POA: Diagnosis not present

## 2018-10-05 DIAGNOSIS — Z7984 Long term (current) use of oral hypoglycemic drugs: Secondary | ICD-10-CM | POA: Diagnosis not present

## 2018-10-05 DIAGNOSIS — M1991 Primary osteoarthritis, unspecified site: Secondary | ICD-10-CM | POA: Diagnosis not present

## 2018-10-05 DIAGNOSIS — Z96651 Presence of right artificial knee joint: Secondary | ICD-10-CM | POA: Diagnosis not present

## 2018-10-05 DIAGNOSIS — G8918 Other acute postprocedural pain: Secondary | ICD-10-CM | POA: Diagnosis not present

## 2018-10-05 DIAGNOSIS — E119 Type 2 diabetes mellitus without complications: Secondary | ICD-10-CM | POA: Diagnosis not present

## 2018-10-05 DIAGNOSIS — E039 Hypothyroidism, unspecified: Secondary | ICD-10-CM | POA: Diagnosis not present

## 2018-10-05 DIAGNOSIS — G4733 Obstructive sleep apnea (adult) (pediatric): Secondary | ICD-10-CM | POA: Diagnosis not present

## 2018-10-05 DIAGNOSIS — Z471 Aftercare following joint replacement surgery: Secondary | ICD-10-CM | POA: Diagnosis not present

## 2018-10-14 DIAGNOSIS — R2689 Other abnormalities of gait and mobility: Secondary | ICD-10-CM | POA: Diagnosis not present

## 2018-10-14 DIAGNOSIS — M25561 Pain in right knee: Secondary | ICD-10-CM | POA: Diagnosis not present

## 2018-10-14 DIAGNOSIS — I10 Essential (primary) hypertension: Secondary | ICD-10-CM | POA: Diagnosis not present

## 2018-10-14 DIAGNOSIS — M25661 Stiffness of right knee, not elsewhere classified: Secondary | ICD-10-CM | POA: Diagnosis not present

## 2018-10-20 DIAGNOSIS — M25661 Stiffness of right knee, not elsewhere classified: Secondary | ICD-10-CM | POA: Diagnosis not present

## 2018-10-20 DIAGNOSIS — R2689 Other abnormalities of gait and mobility: Secondary | ICD-10-CM | POA: Diagnosis not present

## 2018-10-20 DIAGNOSIS — M25561 Pain in right knee: Secondary | ICD-10-CM | POA: Diagnosis not present

## 2018-10-22 DIAGNOSIS — M25661 Stiffness of right knee, not elsewhere classified: Secondary | ICD-10-CM | POA: Diagnosis not present

## 2018-10-22 DIAGNOSIS — M25561 Pain in right knee: Secondary | ICD-10-CM | POA: Diagnosis not present

## 2018-10-22 DIAGNOSIS — R2689 Other abnormalities of gait and mobility: Secondary | ICD-10-CM | POA: Diagnosis not present

## 2018-10-27 DIAGNOSIS — M25561 Pain in right knee: Secondary | ICD-10-CM | POA: Diagnosis not present

## 2018-10-27 DIAGNOSIS — R2689 Other abnormalities of gait and mobility: Secondary | ICD-10-CM | POA: Diagnosis not present

## 2018-10-27 DIAGNOSIS — M25661 Stiffness of right knee, not elsewhere classified: Secondary | ICD-10-CM | POA: Diagnosis not present

## 2018-10-29 DIAGNOSIS — M25661 Stiffness of right knee, not elsewhere classified: Secondary | ICD-10-CM | POA: Diagnosis not present

## 2018-10-29 DIAGNOSIS — R2689 Other abnormalities of gait and mobility: Secondary | ICD-10-CM | POA: Diagnosis not present

## 2018-10-29 DIAGNOSIS — M25561 Pain in right knee: Secondary | ICD-10-CM | POA: Diagnosis not present

## 2018-11-02 DIAGNOSIS — M1711 Unilateral primary osteoarthritis, right knee: Secondary | ICD-10-CM | POA: Diagnosis not present

## 2018-11-02 DIAGNOSIS — Z96659 Presence of unspecified artificial knee joint: Secondary | ICD-10-CM | POA: Diagnosis not present

## 2018-11-03 DIAGNOSIS — M25561 Pain in right knee: Secondary | ICD-10-CM | POA: Diagnosis not present

## 2018-11-03 DIAGNOSIS — R2689 Other abnormalities of gait and mobility: Secondary | ICD-10-CM | POA: Diagnosis not present

## 2018-11-03 DIAGNOSIS — M25661 Stiffness of right knee, not elsewhere classified: Secondary | ICD-10-CM | POA: Diagnosis not present

## 2018-11-05 DIAGNOSIS — M25661 Stiffness of right knee, not elsewhere classified: Secondary | ICD-10-CM | POA: Diagnosis not present

## 2018-11-05 DIAGNOSIS — R2689 Other abnormalities of gait and mobility: Secondary | ICD-10-CM | POA: Diagnosis not present

## 2018-11-05 DIAGNOSIS — G4733 Obstructive sleep apnea (adult) (pediatric): Secondary | ICD-10-CM | POA: Diagnosis not present

## 2018-11-05 DIAGNOSIS — M25561 Pain in right knee: Secondary | ICD-10-CM | POA: Diagnosis not present

## 2018-11-12 DIAGNOSIS — M25661 Stiffness of right knee, not elsewhere classified: Secondary | ICD-10-CM | POA: Diagnosis not present

## 2018-11-12 DIAGNOSIS — M25561 Pain in right knee: Secondary | ICD-10-CM | POA: Diagnosis not present

## 2018-11-12 DIAGNOSIS — R2689 Other abnormalities of gait and mobility: Secondary | ICD-10-CM | POA: Diagnosis not present

## 2018-11-13 DIAGNOSIS — M25561 Pain in right knee: Secondary | ICD-10-CM | POA: Diagnosis not present

## 2018-11-13 DIAGNOSIS — R2689 Other abnormalities of gait and mobility: Secondary | ICD-10-CM | POA: Diagnosis not present

## 2018-11-13 DIAGNOSIS — M25661 Stiffness of right knee, not elsewhere classified: Secondary | ICD-10-CM | POA: Diagnosis not present

## 2018-11-18 DIAGNOSIS — R2689 Other abnormalities of gait and mobility: Secondary | ICD-10-CM | POA: Diagnosis not present

## 2018-11-18 DIAGNOSIS — M25561 Pain in right knee: Secondary | ICD-10-CM | POA: Diagnosis not present

## 2018-11-18 DIAGNOSIS — M25661 Stiffness of right knee, not elsewhere classified: Secondary | ICD-10-CM | POA: Diagnosis not present

## 2018-11-20 DIAGNOSIS — M25561 Pain in right knee: Secondary | ICD-10-CM | POA: Diagnosis not present

## 2018-11-20 DIAGNOSIS — M25661 Stiffness of right knee, not elsewhere classified: Secondary | ICD-10-CM | POA: Diagnosis not present

## 2018-11-20 DIAGNOSIS — R2689 Other abnormalities of gait and mobility: Secondary | ICD-10-CM | POA: Diagnosis not present

## 2018-11-24 DIAGNOSIS — M25661 Stiffness of right knee, not elsewhere classified: Secondary | ICD-10-CM | POA: Diagnosis not present

## 2018-11-24 DIAGNOSIS — M25561 Pain in right knee: Secondary | ICD-10-CM | POA: Diagnosis not present

## 2018-11-24 DIAGNOSIS — R2689 Other abnormalities of gait and mobility: Secondary | ICD-10-CM | POA: Diagnosis not present

## 2018-11-26 DIAGNOSIS — R2689 Other abnormalities of gait and mobility: Secondary | ICD-10-CM | POA: Diagnosis not present

## 2018-11-26 DIAGNOSIS — M25561 Pain in right knee: Secondary | ICD-10-CM | POA: Diagnosis not present

## 2018-11-26 DIAGNOSIS — M25661 Stiffness of right knee, not elsewhere classified: Secondary | ICD-10-CM | POA: Diagnosis not present

## 2018-12-01 DIAGNOSIS — M25661 Stiffness of right knee, not elsewhere classified: Secondary | ICD-10-CM | POA: Diagnosis not present

## 2018-12-01 DIAGNOSIS — M25561 Pain in right knee: Secondary | ICD-10-CM | POA: Diagnosis not present

## 2018-12-01 DIAGNOSIS — R2689 Other abnormalities of gait and mobility: Secondary | ICD-10-CM | POA: Diagnosis not present

## 2018-12-03 DIAGNOSIS — M25661 Stiffness of right knee, not elsewhere classified: Secondary | ICD-10-CM | POA: Diagnosis not present

## 2018-12-03 DIAGNOSIS — R2689 Other abnormalities of gait and mobility: Secondary | ICD-10-CM | POA: Diagnosis not present

## 2018-12-03 DIAGNOSIS — M25561 Pain in right knee: Secondary | ICD-10-CM | POA: Diagnosis not present

## 2018-12-06 DIAGNOSIS — G4733 Obstructive sleep apnea (adult) (pediatric): Secondary | ICD-10-CM | POA: Diagnosis not present

## 2018-12-09 DIAGNOSIS — R2689 Other abnormalities of gait and mobility: Secondary | ICD-10-CM | POA: Diagnosis not present

## 2018-12-09 DIAGNOSIS — M25661 Stiffness of right knee, not elsewhere classified: Secondary | ICD-10-CM | POA: Diagnosis not present

## 2018-12-09 DIAGNOSIS — M25561 Pain in right knee: Secondary | ICD-10-CM | POA: Diagnosis not present

## 2018-12-11 DIAGNOSIS — M25661 Stiffness of right knee, not elsewhere classified: Secondary | ICD-10-CM | POA: Diagnosis not present

## 2018-12-11 DIAGNOSIS — R2689 Other abnormalities of gait and mobility: Secondary | ICD-10-CM | POA: Diagnosis not present

## 2018-12-11 DIAGNOSIS — M25561 Pain in right knee: Secondary | ICD-10-CM | POA: Diagnosis not present

## 2018-12-15 DIAGNOSIS — R2689 Other abnormalities of gait and mobility: Secondary | ICD-10-CM | POA: Diagnosis not present

## 2018-12-15 DIAGNOSIS — M25561 Pain in right knee: Secondary | ICD-10-CM | POA: Diagnosis not present

## 2018-12-15 DIAGNOSIS — M25661 Stiffness of right knee, not elsewhere classified: Secondary | ICD-10-CM | POA: Diagnosis not present

## 2018-12-17 DIAGNOSIS — R2689 Other abnormalities of gait and mobility: Secondary | ICD-10-CM | POA: Diagnosis not present

## 2018-12-17 DIAGNOSIS — M25561 Pain in right knee: Secondary | ICD-10-CM | POA: Diagnosis not present

## 2018-12-17 DIAGNOSIS — M25661 Stiffness of right knee, not elsewhere classified: Secondary | ICD-10-CM | POA: Diagnosis not present

## 2018-12-22 DIAGNOSIS — M25561 Pain in right knee: Secondary | ICD-10-CM | POA: Diagnosis not present

## 2018-12-22 DIAGNOSIS — M25661 Stiffness of right knee, not elsewhere classified: Secondary | ICD-10-CM | POA: Diagnosis not present

## 2018-12-22 DIAGNOSIS — R2689 Other abnormalities of gait and mobility: Secondary | ICD-10-CM | POA: Diagnosis not present

## 2018-12-24 DIAGNOSIS — Z96659 Presence of unspecified artificial knee joint: Secondary | ICD-10-CM | POA: Diagnosis not present

## 2018-12-24 DIAGNOSIS — M1711 Unilateral primary osteoarthritis, right knee: Secondary | ICD-10-CM | POA: Diagnosis not present

## 2018-12-29 DIAGNOSIS — Z23 Encounter for immunization: Secondary | ICD-10-CM | POA: Diagnosis not present

## 2018-12-31 DIAGNOSIS — M25661 Stiffness of right knee, not elsewhere classified: Secondary | ICD-10-CM | POA: Diagnosis not present

## 2018-12-31 DIAGNOSIS — M25561 Pain in right knee: Secondary | ICD-10-CM | POA: Diagnosis not present

## 2018-12-31 DIAGNOSIS — R2689 Other abnormalities of gait and mobility: Secondary | ICD-10-CM | POA: Diagnosis not present

## 2019-01-05 DIAGNOSIS — G4733 Obstructive sleep apnea (adult) (pediatric): Secondary | ICD-10-CM | POA: Diagnosis not present

## 2019-01-07 DIAGNOSIS — M25561 Pain in right knee: Secondary | ICD-10-CM | POA: Diagnosis not present

## 2019-01-07 DIAGNOSIS — R2689 Other abnormalities of gait and mobility: Secondary | ICD-10-CM | POA: Diagnosis not present

## 2019-01-07 DIAGNOSIS — M25661 Stiffness of right knee, not elsewhere classified: Secondary | ICD-10-CM | POA: Diagnosis not present

## 2019-01-21 DIAGNOSIS — M25561 Pain in right knee: Secondary | ICD-10-CM | POA: Diagnosis not present

## 2019-01-21 DIAGNOSIS — R2689 Other abnormalities of gait and mobility: Secondary | ICD-10-CM | POA: Diagnosis not present

## 2019-01-21 DIAGNOSIS — M25661 Stiffness of right knee, not elsewhere classified: Secondary | ICD-10-CM | POA: Diagnosis not present

## 2019-01-28 DIAGNOSIS — M25561 Pain in right knee: Secondary | ICD-10-CM | POA: Diagnosis not present

## 2019-01-28 DIAGNOSIS — M25661 Stiffness of right knee, not elsewhere classified: Secondary | ICD-10-CM | POA: Diagnosis not present

## 2019-01-28 DIAGNOSIS — R2689 Other abnormalities of gait and mobility: Secondary | ICD-10-CM | POA: Diagnosis not present

## 2019-02-04 DIAGNOSIS — M25561 Pain in right knee: Secondary | ICD-10-CM | POA: Diagnosis not present

## 2019-02-05 DIAGNOSIS — G4733 Obstructive sleep apnea (adult) (pediatric): Secondary | ICD-10-CM | POA: Diagnosis not present

## 2019-02-09 DIAGNOSIS — E063 Autoimmune thyroiditis: Secondary | ICD-10-CM | POA: Diagnosis not present

## 2019-02-09 DIAGNOSIS — E785 Hyperlipidemia, unspecified: Secondary | ICD-10-CM | POA: Diagnosis not present

## 2019-02-09 DIAGNOSIS — E118 Type 2 diabetes mellitus with unspecified complications: Secondary | ICD-10-CM | POA: Diagnosis not present

## 2019-02-09 DIAGNOSIS — I2510 Atherosclerotic heart disease of native coronary artery without angina pectoris: Secondary | ICD-10-CM | POA: Diagnosis not present

## 2019-02-09 DIAGNOSIS — I2584 Coronary atherosclerosis due to calcified coronary lesion: Secondary | ICD-10-CM | POA: Diagnosis not present

## 2019-02-12 DIAGNOSIS — E063 Autoimmune thyroiditis: Secondary | ICD-10-CM | POA: Diagnosis not present

## 2019-02-12 DIAGNOSIS — E118 Type 2 diabetes mellitus with unspecified complications: Secondary | ICD-10-CM | POA: Diagnosis not present

## 2019-02-12 DIAGNOSIS — E785 Hyperlipidemia, unspecified: Secondary | ICD-10-CM | POA: Diagnosis not present

## 2019-02-15 ENCOUNTER — Other Ambulatory Visit: Payer: Self-pay

## 2019-02-15 NOTE — Patient Outreach (Signed)
[facility]) Care Management  02/15/2019  Fue Stipes [birth year] [id number]   Medication Adherence call to Mr. Jemiah Bassin [facility] spoke with patient he is past due on Metformin 500 mg,patient explain he was only taking 1 tablet daily but after taking to someone at doctors office they explain he is suppose to be taking 2 tablets twice daily and he has started taking it this way,patient explain he has plenty at this time. Mr. Bloemer is showing past due under [place].   [facility] Direct Dial [phone]  Fax [phone] [email or web address]

## 2019-02-25 DIAGNOSIS — G4733 Obstructive sleep apnea (adult) (pediatric): Secondary | ICD-10-CM | POA: Diagnosis not present

## 2019-03-07 DIAGNOSIS — G4733 Obstructive sleep apnea (adult) (pediatric): Secondary | ICD-10-CM | POA: Diagnosis not present

## 2019-04-07 DIAGNOSIS — G4733 Obstructive sleep apnea (adult) (pediatric): Secondary | ICD-10-CM | POA: Diagnosis not present

## 2019-05-08 DIAGNOSIS — G4733 Obstructive sleep apnea (adult) (pediatric): Secondary | ICD-10-CM | POA: Diagnosis not present

## 2019-05-12 DIAGNOSIS — M25561 Pain in right knee: Secondary | ICD-10-CM | POA: Diagnosis not present

## 2019-05-19 DIAGNOSIS — Z139 Encounter for screening, unspecified: Secondary | ICD-10-CM | POA: Diagnosis not present

## 2019-05-19 DIAGNOSIS — G2581 Restless legs syndrome: Secondary | ICD-10-CM | POA: Diagnosis not present

## 2019-05-19 DIAGNOSIS — E785 Hyperlipidemia, unspecified: Secondary | ICD-10-CM | POA: Diagnosis not present

## 2019-05-19 DIAGNOSIS — E118 Type 2 diabetes mellitus with unspecified complications: Secondary | ICD-10-CM | POA: Diagnosis not present

## 2019-05-19 DIAGNOSIS — E063 Autoimmune thyroiditis: Secondary | ICD-10-CM | POA: Diagnosis not present

## 2019-05-21 DIAGNOSIS — E785 Hyperlipidemia, unspecified: Secondary | ICD-10-CM | POA: Diagnosis not present

## 2019-05-21 DIAGNOSIS — E063 Autoimmune thyroiditis: Secondary | ICD-10-CM | POA: Diagnosis not present

## 2019-05-21 DIAGNOSIS — E118 Type 2 diabetes mellitus with unspecified complications: Secondary | ICD-10-CM | POA: Diagnosis not present

## 2019-06-05 DIAGNOSIS — G4733 Obstructive sleep apnea (adult) (pediatric): Secondary | ICD-10-CM | POA: Diagnosis not present

## 2019-06-25 DIAGNOSIS — G4733 Obstructive sleep apnea (adult) (pediatric): Secondary | ICD-10-CM | POA: Diagnosis not present

## 2019-07-06 DIAGNOSIS — G4733 Obstructive sleep apnea (adult) (pediatric): Secondary | ICD-10-CM | POA: Diagnosis not present

## 2019-07-09 DIAGNOSIS — M47812 Spondylosis without myelopathy or radiculopathy, cervical region: Secondary | ICD-10-CM | POA: Diagnosis not present

## 2019-08-05 DIAGNOSIS — E039 Hypothyroidism, unspecified: Secondary | ICD-10-CM | POA: Diagnosis not present

## 2019-08-05 DIAGNOSIS — G4733 Obstructive sleep apnea (adult) (pediatric): Secondary | ICD-10-CM | POA: Diagnosis not present

## 2019-08-05 DIAGNOSIS — E118 Type 2 diabetes mellitus with unspecified complications: Secondary | ICD-10-CM | POA: Diagnosis not present

## 2019-08-05 DIAGNOSIS — I2510 Atherosclerotic heart disease of native coronary artery without angina pectoris: Secondary | ICD-10-CM | POA: Diagnosis not present

## 2019-08-05 DIAGNOSIS — E785 Hyperlipidemia, unspecified: Secondary | ICD-10-CM | POA: Diagnosis not present

## 2019-08-05 DIAGNOSIS — G2581 Restless legs syndrome: Secondary | ICD-10-CM | POA: Diagnosis not present

## 2019-08-20 DIAGNOSIS — E785 Hyperlipidemia, unspecified: Secondary | ICD-10-CM | POA: Diagnosis not present

## 2019-08-20 DIAGNOSIS — G2581 Restless legs syndrome: Secondary | ICD-10-CM | POA: Diagnosis not present

## 2019-08-20 DIAGNOSIS — R7989 Other specified abnormal findings of blood chemistry: Secondary | ICD-10-CM | POA: Diagnosis not present

## 2019-08-20 DIAGNOSIS — I2510 Atherosclerotic heart disease of native coronary artery without angina pectoris: Secondary | ICD-10-CM | POA: Diagnosis not present

## 2019-08-24 DIAGNOSIS — M47812 Spondylosis without myelopathy or radiculopathy, cervical region: Secondary | ICD-10-CM | POA: Diagnosis not present

## 2019-09-05 DIAGNOSIS — G4733 Obstructive sleep apnea (adult) (pediatric): Secondary | ICD-10-CM | POA: Diagnosis not present

## 2019-09-29 DIAGNOSIS — Z0000 Encounter for general adult medical examination without abnormal findings: Secondary | ICD-10-CM | POA: Diagnosis not present

## 2019-09-29 DIAGNOSIS — E785 Hyperlipidemia, unspecified: Secondary | ICD-10-CM | POA: Diagnosis not present

## 2019-09-29 DIAGNOSIS — Z9181 History of falling: Secondary | ICD-10-CM | POA: Diagnosis not present

## 2019-10-04 DIAGNOSIS — I2584 Coronary atherosclerosis due to calcified coronary lesion: Secondary | ICD-10-CM | POA: Diagnosis not present

## 2019-10-04 DIAGNOSIS — I2510 Atherosclerotic heart disease of native coronary artery without angina pectoris: Secondary | ICD-10-CM | POA: Diagnosis not present

## 2019-10-04 DIAGNOSIS — K429 Umbilical hernia without obstruction or gangrene: Secondary | ICD-10-CM | POA: Diagnosis not present

## 2019-10-22 DIAGNOSIS — E785 Hyperlipidemia, unspecified: Secondary | ICD-10-CM | POA: Diagnosis not present

## 2019-10-22 DIAGNOSIS — I2510 Atherosclerotic heart disease of native coronary artery without angina pectoris: Secondary | ICD-10-CM | POA: Diagnosis not present

## 2019-10-22 DIAGNOSIS — I2584 Coronary atherosclerosis due to calcified coronary lesion: Secondary | ICD-10-CM | POA: Diagnosis not present

## 2019-10-22 DIAGNOSIS — G2581 Restless legs syndrome: Secondary | ICD-10-CM | POA: Diagnosis not present

## 2019-11-07 DIAGNOSIS — R42 Dizziness and giddiness: Secondary | ICD-10-CM | POA: Diagnosis not present

## 2019-11-07 DIAGNOSIS — R5383 Other fatigue: Secondary | ICD-10-CM | POA: Diagnosis not present

## 2019-11-07 DIAGNOSIS — Z20822 Contact with and (suspected) exposure to covid-19: Secondary | ICD-10-CM | POA: Diagnosis not present

## 2019-11-10 DIAGNOSIS — M1711 Unilateral primary osteoarthritis, right knee: Secondary | ICD-10-CM | POA: Diagnosis not present

## 2019-11-10 DIAGNOSIS — Z96659 Presence of unspecified artificial knee joint: Secondary | ICD-10-CM | POA: Diagnosis not present

## 2019-11-16 DIAGNOSIS — Z6831 Body mass index (BMI) 31.0-31.9, adult: Secondary | ICD-10-CM

## 2019-11-16 DIAGNOSIS — M6208 Separation of muscle (nontraumatic), other site: Secondary | ICD-10-CM | POA: Insufficient documentation

## 2019-11-16 DIAGNOSIS — K429 Umbilical hernia without obstruction or gangrene: Secondary | ICD-10-CM

## 2019-11-16 DIAGNOSIS — Z1159 Encounter for screening for other viral diseases: Secondary | ICD-10-CM | POA: Insufficient documentation

## 2019-11-16 HISTORY — DX: Body mass index (BMI) 31.0-31.9, adult: Z68.31

## 2019-11-16 HISTORY — DX: Encounter for screening for other viral diseases: Z11.59

## 2019-11-16 HISTORY — DX: Umbilical hernia without obstruction or gangrene: K42.9

## 2019-11-16 HISTORY — DX: Separation of muscle (nontraumatic), other site: M62.08

## 2019-11-24 DIAGNOSIS — H6693 Otitis media, unspecified, bilateral: Secondary | ICD-10-CM | POA: Diagnosis not present

## 2019-11-24 DIAGNOSIS — R42 Dizziness and giddiness: Secondary | ICD-10-CM | POA: Diagnosis not present

## 2019-11-24 DIAGNOSIS — I2510 Atherosclerotic heart disease of native coronary artery without angina pectoris: Secondary | ICD-10-CM | POA: Diagnosis not present

## 2019-11-24 DIAGNOSIS — R5382 Chronic fatigue, unspecified: Secondary | ICD-10-CM | POA: Diagnosis not present

## 2019-11-30 DIAGNOSIS — D860 Sarcoidosis of lung: Secondary | ICD-10-CM | POA: Diagnosis not present

## 2019-11-30 DIAGNOSIS — R05 Cough: Secondary | ICD-10-CM | POA: Diagnosis not present

## 2019-11-30 DIAGNOSIS — G4733 Obstructive sleep apnea (adult) (pediatric): Secondary | ICD-10-CM | POA: Diagnosis not present

## 2019-12-01 DIAGNOSIS — G2581 Restless legs syndrome: Secondary | ICD-10-CM | POA: Diagnosis not present

## 2019-12-01 DIAGNOSIS — I2510 Atherosclerotic heart disease of native coronary artery without angina pectoris: Secondary | ICD-10-CM | POA: Diagnosis not present

## 2019-12-01 DIAGNOSIS — I2584 Coronary atherosclerosis due to calcified coronary lesion: Secondary | ICD-10-CM | POA: Diagnosis not present

## 2019-12-08 DIAGNOSIS — G4733 Obstructive sleep apnea (adult) (pediatric): Secondary | ICD-10-CM | POA: Diagnosis not present

## 2019-12-08 DIAGNOSIS — R05 Cough: Secondary | ICD-10-CM | POA: Diagnosis not present

## 2019-12-08 DIAGNOSIS — D860 Sarcoidosis of lung: Secondary | ICD-10-CM | POA: Diagnosis not present

## 2019-12-10 DIAGNOSIS — Z01812 Encounter for preprocedural laboratory examination: Secondary | ICD-10-CM | POA: Diagnosis not present

## 2019-12-10 DIAGNOSIS — K429 Umbilical hernia without obstruction or gangrene: Secondary | ICD-10-CM | POA: Diagnosis not present

## 2019-12-17 DIAGNOSIS — Z7984 Long term (current) use of oral hypoglycemic drugs: Secondary | ICD-10-CM | POA: Diagnosis not present

## 2019-12-17 DIAGNOSIS — Z79899 Other long term (current) drug therapy: Secondary | ICD-10-CM | POA: Diagnosis not present

## 2019-12-17 DIAGNOSIS — E039 Hypothyroidism, unspecified: Secondary | ICD-10-CM | POA: Diagnosis not present

## 2019-12-17 DIAGNOSIS — G2581 Restless legs syndrome: Secondary | ICD-10-CM | POA: Diagnosis not present

## 2019-12-17 DIAGNOSIS — K420 Umbilical hernia with obstruction, without gangrene: Secondary | ICD-10-CM | POA: Diagnosis not present

## 2019-12-17 DIAGNOSIS — Z9989 Dependence on other enabling machines and devices: Secondary | ICD-10-CM | POA: Diagnosis not present

## 2019-12-17 DIAGNOSIS — E119 Type 2 diabetes mellitus without complications: Secondary | ICD-10-CM | POA: Diagnosis not present

## 2019-12-17 DIAGNOSIS — E785 Hyperlipidemia, unspecified: Secondary | ICD-10-CM | POA: Diagnosis not present

## 2019-12-17 DIAGNOSIS — Z96652 Presence of left artificial knee joint: Secondary | ICD-10-CM | POA: Diagnosis not present

## 2019-12-17 DIAGNOSIS — M1990 Unspecified osteoarthritis, unspecified site: Secondary | ICD-10-CM | POA: Diagnosis not present

## 2019-12-17 DIAGNOSIS — M6208 Separation of muscle (nontraumatic), other site: Secondary | ICD-10-CM | POA: Diagnosis not present

## 2019-12-17 DIAGNOSIS — G4733 Obstructive sleep apnea (adult) (pediatric): Secondary | ICD-10-CM | POA: Diagnosis not present

## 2019-12-17 DIAGNOSIS — Z7982 Long term (current) use of aspirin: Secondary | ICD-10-CM | POA: Diagnosis not present

## 2019-12-17 DIAGNOSIS — K429 Umbilical hernia without obstruction or gangrene: Secondary | ICD-10-CM | POA: Diagnosis not present

## 2019-12-23 DIAGNOSIS — K760 Fatty (change of) liver, not elsewhere classified: Secondary | ICD-10-CM | POA: Diagnosis not present

## 2019-12-23 DIAGNOSIS — I77810 Thoracic aortic ectasia: Secondary | ICD-10-CM | POA: Diagnosis not present

## 2019-12-23 DIAGNOSIS — I2510 Atherosclerotic heart disease of native coronary artery without angina pectoris: Secondary | ICD-10-CM | POA: Diagnosis not present

## 2019-12-23 DIAGNOSIS — Z23 Encounter for immunization: Secondary | ICD-10-CM | POA: Diagnosis not present

## 2019-12-23 DIAGNOSIS — E118 Type 2 diabetes mellitus with unspecified complications: Secondary | ICD-10-CM | POA: Diagnosis not present

## 2019-12-23 DIAGNOSIS — R05 Cough: Secondary | ICD-10-CM | POA: Diagnosis not present

## 2019-12-23 DIAGNOSIS — R918 Other nonspecific abnormal finding of lung field: Secondary | ICD-10-CM | POA: Diagnosis not present

## 2019-12-23 DIAGNOSIS — I700 Atherosclerosis of aorta: Secondary | ICD-10-CM | POA: Diagnosis not present

## 2019-12-23 DIAGNOSIS — J984 Other disorders of lung: Secondary | ICD-10-CM | POA: Diagnosis not present

## 2019-12-23 DIAGNOSIS — I2584 Coronary atherosclerosis due to calcified coronary lesion: Secondary | ICD-10-CM | POA: Diagnosis not present

## 2019-12-27 DIAGNOSIS — Z09 Encounter for follow-up examination after completed treatment for conditions other than malignant neoplasm: Secondary | ICD-10-CM | POA: Insufficient documentation

## 2019-12-27 HISTORY — DX: Encounter for follow-up examination after completed treatment for conditions other than malignant neoplasm: Z09

## 2019-12-30 DIAGNOSIS — R059 Cough, unspecified: Secondary | ICD-10-CM | POA: Diagnosis not present

## 2019-12-30 DIAGNOSIS — G4733 Obstructive sleep apnea (adult) (pediatric): Secondary | ICD-10-CM | POA: Diagnosis not present

## 2019-12-30 DIAGNOSIS — D860 Sarcoidosis of lung: Secondary | ICD-10-CM | POA: Diagnosis not present

## 2020-01-18 DIAGNOSIS — E039 Hypothyroidism, unspecified: Secondary | ICD-10-CM | POA: Diagnosis not present

## 2020-01-18 DIAGNOSIS — E785 Hyperlipidemia, unspecified: Secondary | ICD-10-CM | POA: Diagnosis not present

## 2021-04-20 DIAGNOSIS — G2581 Restless legs syndrome: Secondary | ICD-10-CM | POA: Diagnosis not present

## 2021-04-20 DIAGNOSIS — J309 Allergic rhinitis, unspecified: Secondary | ICD-10-CM | POA: Diagnosis not present

## 2021-04-20 DIAGNOSIS — E785 Hyperlipidemia, unspecified: Secondary | ICD-10-CM | POA: Diagnosis not present

## 2021-04-20 DIAGNOSIS — G4700 Insomnia, unspecified: Secondary | ICD-10-CM | POA: Diagnosis not present

## 2021-04-20 DIAGNOSIS — G4730 Sleep apnea, unspecified: Secondary | ICD-10-CM | POA: Diagnosis not present

## 2021-04-20 DIAGNOSIS — E1169 Type 2 diabetes mellitus with other specified complication: Secondary | ICD-10-CM | POA: Diagnosis not present

## 2021-04-20 DIAGNOSIS — I2584 Coronary atherosclerosis due to calcified coronary lesion: Secondary | ICD-10-CM | POA: Diagnosis not present

## 2021-04-20 DIAGNOSIS — I2510 Atherosclerotic heart disease of native coronary artery without angina pectoris: Secondary | ICD-10-CM | POA: Diagnosis not present

## 2021-04-20 DIAGNOSIS — E039 Hypothyroidism, unspecified: Secondary | ICD-10-CM | POA: Diagnosis not present

## 2021-04-20 DIAGNOSIS — M159 Polyosteoarthritis, unspecified: Secondary | ICD-10-CM | POA: Diagnosis not present

## 2021-05-31 DIAGNOSIS — E1169 Type 2 diabetes mellitus with other specified complication: Secondary | ICD-10-CM | POA: Diagnosis not present

## 2021-05-31 DIAGNOSIS — M159 Polyosteoarthritis, unspecified: Secondary | ICD-10-CM | POA: Diagnosis not present

## 2021-05-31 DIAGNOSIS — G4730 Sleep apnea, unspecified: Secondary | ICD-10-CM | POA: Diagnosis not present

## 2021-05-31 DIAGNOSIS — E785 Hyperlipidemia, unspecified: Secondary | ICD-10-CM | POA: Diagnosis not present

## 2021-05-31 DIAGNOSIS — G2581 Restless legs syndrome: Secondary | ICD-10-CM | POA: Diagnosis not present

## 2021-05-31 DIAGNOSIS — M542 Cervicalgia: Secondary | ICD-10-CM | POA: Diagnosis not present

## 2021-05-31 DIAGNOSIS — J309 Allergic rhinitis, unspecified: Secondary | ICD-10-CM | POA: Diagnosis not present

## 2021-05-31 DIAGNOSIS — M2578 Osteophyte, vertebrae: Secondary | ICD-10-CM | POA: Diagnosis not present

## 2021-05-31 DIAGNOSIS — I2510 Atherosclerotic heart disease of native coronary artery without angina pectoris: Secondary | ICD-10-CM | POA: Diagnosis not present

## 2021-05-31 DIAGNOSIS — I2584 Coronary atherosclerosis due to calcified coronary lesion: Secondary | ICD-10-CM | POA: Diagnosis not present

## 2021-05-31 DIAGNOSIS — E039 Hypothyroidism, unspecified: Secondary | ICD-10-CM | POA: Diagnosis not present

## 2021-07-19 DIAGNOSIS — I2510 Atherosclerotic heart disease of native coronary artery without angina pectoris: Secondary | ICD-10-CM | POA: Diagnosis not present

## 2021-07-19 DIAGNOSIS — E785 Hyperlipidemia, unspecified: Secondary | ICD-10-CM | POA: Diagnosis not present

## 2021-07-19 DIAGNOSIS — J309 Allergic rhinitis, unspecified: Secondary | ICD-10-CM | POA: Diagnosis not present

## 2021-07-19 DIAGNOSIS — G4730 Sleep apnea, unspecified: Secondary | ICD-10-CM | POA: Diagnosis not present

## 2021-07-19 DIAGNOSIS — E039 Hypothyroidism, unspecified: Secondary | ICD-10-CM | POA: Diagnosis not present

## 2021-07-19 DIAGNOSIS — I2584 Coronary atherosclerosis due to calcified coronary lesion: Secondary | ICD-10-CM | POA: Diagnosis not present

## 2021-07-19 DIAGNOSIS — G2581 Restless legs syndrome: Secondary | ICD-10-CM | POA: Diagnosis not present

## 2021-07-19 DIAGNOSIS — E1169 Type 2 diabetes mellitus with other specified complication: Secondary | ICD-10-CM | POA: Diagnosis not present

## 2021-07-26 DIAGNOSIS — G4733 Obstructive sleep apnea (adult) (pediatric): Secondary | ICD-10-CM | POA: Diagnosis not present

## 2021-07-26 DIAGNOSIS — D860 Sarcoidosis of lung: Secondary | ICD-10-CM | POA: Diagnosis not present

## 2021-07-26 DIAGNOSIS — R059 Cough, unspecified: Secondary | ICD-10-CM | POA: Diagnosis not present

## 2021-07-26 DIAGNOSIS — G2581 Restless legs syndrome: Secondary | ICD-10-CM | POA: Diagnosis not present

## 2021-07-26 DIAGNOSIS — R918 Other nonspecific abnormal finding of lung field: Secondary | ICD-10-CM | POA: Diagnosis not present

## 2021-07-27 DIAGNOSIS — G2581 Restless legs syndrome: Secondary | ICD-10-CM | POA: Diagnosis not present

## 2021-07-27 DIAGNOSIS — E1169 Type 2 diabetes mellitus with other specified complication: Secondary | ICD-10-CM | POA: Diagnosis not present

## 2021-07-27 DIAGNOSIS — G4730 Sleep apnea, unspecified: Secondary | ICD-10-CM | POA: Diagnosis not present

## 2021-07-27 DIAGNOSIS — E875 Hyperkalemia: Secondary | ICD-10-CM | POA: Diagnosis not present

## 2021-07-27 DIAGNOSIS — I2584 Coronary atherosclerosis due to calcified coronary lesion: Secondary | ICD-10-CM | POA: Diagnosis not present

## 2021-07-27 DIAGNOSIS — J309 Allergic rhinitis, unspecified: Secondary | ICD-10-CM | POA: Diagnosis not present

## 2021-07-27 DIAGNOSIS — E039 Hypothyroidism, unspecified: Secondary | ICD-10-CM | POA: Diagnosis not present

## 2021-07-27 DIAGNOSIS — I2510 Atherosclerotic heart disease of native coronary artery without angina pectoris: Secondary | ICD-10-CM | POA: Diagnosis not present

## 2021-07-27 DIAGNOSIS — E785 Hyperlipidemia, unspecified: Secondary | ICD-10-CM | POA: Diagnosis not present

## 2021-08-07 DIAGNOSIS — D869 Sarcoidosis, unspecified: Secondary | ICD-10-CM | POA: Diagnosis not present

## 2021-08-07 DIAGNOSIS — R059 Cough, unspecified: Secondary | ICD-10-CM | POA: Diagnosis not present

## 2021-08-07 DIAGNOSIS — D860 Sarcoidosis of lung: Secondary | ICD-10-CM | POA: Diagnosis not present

## 2021-08-07 DIAGNOSIS — I719 Aortic aneurysm of unspecified site, without rupture: Secondary | ICD-10-CM | POA: Diagnosis not present

## 2021-08-08 DIAGNOSIS — G4733 Obstructive sleep apnea (adult) (pediatric): Secondary | ICD-10-CM | POA: Diagnosis not present

## 2021-08-10 DIAGNOSIS — J309 Allergic rhinitis, unspecified: Secondary | ICD-10-CM | POA: Diagnosis not present

## 2021-08-10 DIAGNOSIS — I2510 Atherosclerotic heart disease of native coronary artery without angina pectoris: Secondary | ICD-10-CM | POA: Diagnosis not present

## 2021-08-10 DIAGNOSIS — E785 Hyperlipidemia, unspecified: Secondary | ICD-10-CM | POA: Diagnosis not present

## 2021-08-10 DIAGNOSIS — I2584 Coronary atherosclerosis due to calcified coronary lesion: Secondary | ICD-10-CM | POA: Diagnosis not present

## 2021-08-10 DIAGNOSIS — E1169 Type 2 diabetes mellitus with other specified complication: Secondary | ICD-10-CM | POA: Diagnosis not present

## 2021-08-10 DIAGNOSIS — G2581 Restless legs syndrome: Secondary | ICD-10-CM | POA: Diagnosis not present

## 2021-08-10 DIAGNOSIS — G4730 Sleep apnea, unspecified: Secondary | ICD-10-CM | POA: Diagnosis not present

## 2021-08-10 DIAGNOSIS — E875 Hyperkalemia: Secondary | ICD-10-CM | POA: Diagnosis not present

## 2021-08-10 DIAGNOSIS — E039 Hypothyroidism, unspecified: Secondary | ICD-10-CM | POA: Diagnosis not present

## 2021-08-23 DIAGNOSIS — D860 Sarcoidosis of lung: Secondary | ICD-10-CM | POA: Diagnosis not present

## 2021-08-23 DIAGNOSIS — R059 Cough, unspecified: Secondary | ICD-10-CM | POA: Diagnosis not present

## 2021-08-23 DIAGNOSIS — R918 Other nonspecific abnormal finding of lung field: Secondary | ICD-10-CM | POA: Diagnosis not present

## 2021-08-23 DIAGNOSIS — G2581 Restless legs syndrome: Secondary | ICD-10-CM | POA: Diagnosis not present

## 2021-08-23 DIAGNOSIS — G4733 Obstructive sleep apnea (adult) (pediatric): Secondary | ICD-10-CM | POA: Diagnosis not present

## 2021-08-24 ENCOUNTER — Other Ambulatory Visit: Payer: Self-pay

## 2021-08-24 DIAGNOSIS — E039 Hypothyroidism, unspecified: Secondary | ICD-10-CM | POA: Insufficient documentation

## 2021-08-24 DIAGNOSIS — G2581 Restless legs syndrome: Secondary | ICD-10-CM | POA: Insufficient documentation

## 2021-08-24 DIAGNOSIS — U071 COVID-19: Secondary | ICD-10-CM | POA: Insufficient documentation

## 2021-08-24 DIAGNOSIS — E785 Hyperlipidemia, unspecified: Secondary | ICD-10-CM | POA: Insufficient documentation

## 2021-09-07 DIAGNOSIS — M159 Polyosteoarthritis, unspecified: Secondary | ICD-10-CM | POA: Diagnosis not present

## 2021-09-07 DIAGNOSIS — E875 Hyperkalemia: Secondary | ICD-10-CM | POA: Diagnosis not present

## 2021-09-07 DIAGNOSIS — M722 Plantar fascial fibromatosis: Secondary | ICD-10-CM | POA: Diagnosis not present

## 2021-09-07 DIAGNOSIS — I2510 Atherosclerotic heart disease of native coronary artery without angina pectoris: Secondary | ICD-10-CM | POA: Diagnosis not present

## 2021-09-07 DIAGNOSIS — J309 Allergic rhinitis, unspecified: Secondary | ICD-10-CM | POA: Diagnosis not present

## 2021-09-07 DIAGNOSIS — G4730 Sleep apnea, unspecified: Secondary | ICD-10-CM | POA: Diagnosis not present

## 2021-09-07 DIAGNOSIS — E785 Hyperlipidemia, unspecified: Secondary | ICD-10-CM | POA: Diagnosis not present

## 2021-09-07 DIAGNOSIS — I2584 Coronary atherosclerosis due to calcified coronary lesion: Secondary | ICD-10-CM | POA: Diagnosis not present

## 2021-09-07 DIAGNOSIS — G4700 Insomnia, unspecified: Secondary | ICD-10-CM | POA: Diagnosis not present

## 2021-09-07 DIAGNOSIS — G2581 Restless legs syndrome: Secondary | ICD-10-CM | POA: Diagnosis not present

## 2021-09-20 ENCOUNTER — Ambulatory Visit: Payer: Medicare Other | Admitting: Cardiology

## 2021-09-20 ENCOUNTER — Encounter: Payer: Self-pay | Admitting: Cardiology

## 2021-09-20 VITALS — BP 106/62 | HR 69 | Ht 69.0 in | Wt 211.2 lb

## 2021-09-20 DIAGNOSIS — I2510 Atherosclerotic heart disease of native coronary artery without angina pectoris: Secondary | ICD-10-CM

## 2021-09-20 DIAGNOSIS — E669 Obesity, unspecified: Secondary | ICD-10-CM | POA: Diagnosis not present

## 2021-09-20 DIAGNOSIS — E66811 Obesity, class 1: Secondary | ICD-10-CM

## 2021-09-20 DIAGNOSIS — E782 Mixed hyperlipidemia: Secondary | ICD-10-CM

## 2021-09-20 HISTORY — DX: Obesity, class 1: E66.811

## 2021-09-20 NOTE — Addendum Note (Signed)
Addended by: Truddie Hidden on: 09/20/2021 03:11 PM   Modules accepted: Orders

## 2021-09-20 NOTE — Patient Instructions (Signed)
Medication Instructions:  Your physician recommends that you continue on your current medications as directed. Please refer to the Current Medication list given to you today.  *If you need a refill on your cardiac medications before your next appointment, please call your pharmacy*   Lab Work: None ordered If you have labs (blood work) drawn today and your tests are completely normal, you will receive your results only by: [place] (if you have MyChart) OR A paper copy in the mail If you have any lab test that is abnormal or we need to change your treatment, we will call you to review the results.   Testing/Procedures: Stress Echocardiogram Information Sheet Instructions:  This test will be done at [facility].  Nothing to eat or drink after midnight.  Dress prepared to exercise.  Please bring all current prescription medications.   Follow-Up: At [facility], you and your health needs are our priority.  As part of our continuing mission to provide you with exceptional heart care, we have created designated Provider Care Teams.  These Care Teams include your primary Cardiologist (physician) and Advanced Practice Providers (APPs -  Physician Assistants and Nurse Practitioners) who all work together to provide you with the care you need, when you need it.  We recommend signing up for the patient portal called "MyChart".  Sign up information is provided on this After Visit Summary.  MyChart is used to connect with patients for Virtual Visits (Telemedicine).  Patients are able to view lab/test results, encounter notes, upcoming appointments, etc.  Non-urgent messages can be sent to your provider as well.   To learn more about what you can do with MyChart, go to [email or web address].    Your next appointment:   12 month(s)  The format for your next appointment:   In Person  Provider:   Jyl Heinz, MD   Other Instructions Exercise Stress Echocardiogram An  exercise stress echocardiogram is a test to check how well your heart is working. This test uses sound waves (ultrasound) and a computer to make images of your heart before and after exercise. Ultrasound images that are taken before you exercise (resting echocardiogram) will show how much blood is getting to your heart muscle and how well yourheart muscle and heart valves are functioning. During the next part of this test, you will walk on a treadmill or ride a stationary bicycle to see how exercise affects your heart. While you exercise, the electrical activity of your heart will be monitored with anelectrocardiogram (ECG). Your blood pressure will also be monitored. You may have this test if you have: Chest pain or other symptoms of a heart problem. Recently had a heart attack or heart surgery. Heart valve problems. A condition that causes narrowing of the blood vessels that supply your heart (coronary artery disease). A high risk of heart disease and are starting a new exercise program. A high risk of heart disease and need to have major surgery. Heart arrhythmia (abnormal heart rhythm) problems. Heart failure problems. Tell a health care provider about: Any allergies you have. All medicines you are taking, including vitamins, herbs, eye drops, creams, and over-the-counter medicines. Any problems you or family members have had with anesthetic medicines. Any surgeries you have had. Any blood disorders you have. Any medical conditions you have. Whether you are pregnant or may be pregnant. What are the risks? Generally, this is a safe test. However, problems may occur, including: Chest pain. Dizziness or light-headedness. Shortness of breath. Increased or irregular  heartbeat (palpitations). Nausea or vomiting. Heart attack. This is very rare. What happens before the test? Medicines Ask your health care provider about changing or stopping your regular medicines. This is especially  important if you are taking diabetes medicines or blood thinners. If you use an inhaler, bring it with you to the test. General instructions Wear loose, comfortable clothing and walking shoes. Follow instructions from your health care provider about eating or drinking restrictions. You may be asked to avoid all forms of caffeine for 24 hours before your test, or as told by your health care provider. Do not use any products that contain nicotine or tobacco for 4 hours before the test, or as told by your health care provider. These products include cigarettes, chewing tobacco, and vaping devices, such as e-cigarettes. If you need help quitting, ask your health care provider. What happens during the test?  You will take off your clothes from the waist up and put on a hospital gown. Electrodes or electrocardiogram (ECG) patches may be placed on your chest. The electrodes or patches are then connected to a device that monitors your heart rate and rhythm. A blood pressure cuff will be placed on your arm. You will lie down on a table for an ultrasound exam before you exercise. A gel will be applied to your chest to help sound waves pass through your skin. A handheld device, called a transducer, will be pressed against your chest and moved over your heart. The transducer produces sound waves that travel to your heart and bounce back (or "echo" back) to the transducer. These sound waves will be captured in real-time and changed into images of your heart that can be viewed on a video monitor. The images will be recorded on a computer and reviewed by your health care provider. Once the ultrasound is complete, you will start exercising by walking on a treadmill or pedaling a stationary bicycle. Your blood pressure and heart rhythm will be monitored while you exercise. The exercise will gradually get harder or faster. You will exercise until: Your heart reaches a target level. You are too tired to  continue. You cannot continue because of chest pain, weakness, or dizziness. You will have another ultrasound exam immediately after you stop exercising. The procedure may vary among health care providers and hospitals. What can I expect after the test? After your test, it is common to have: Mild soreness. Mild fatigue. Your heart rate and blood pressure will be monitored until they return to yournormal levels. You should not have any new symptoms after this test. Follow these instructions at home: After your stress test, you should be able to return to your normal activities and diet. Take over-the-counter and prescription medicines only as told by your health care provider. Keep all follow-up visits. This is important. It is up to you to get the results of your test. Ask your health care provider, or the department that is doing the test, when your results will be ready. Contact a health care provider if you: Feel dizzy or light-headed. Have a fast or irregular heartbeat. Have nausea or vomiting. Have a headache. Feel short of breath. Get help right away if you: Develop pain or pressure: In your chest. In your jaw or neck. Between your shoulder blades. Radiating down your left arm. Faint. Have trouble breathing. These symptoms may represent a serious problem that is an emergency. Do not wait to see if the symptoms will go away. Get medical help right away. Call  your local emergency services (911 in the [place] Do not drive yourself to the hospital. Summary An exercise stress echocardiogram is a test that uses ultrasound to check how well your heart works before and after exercise. Before the test, follow instructions from your health care provider about stopping medicines and avoiding caffeine, nicotine and tobacco, and certain foods and drinks. During the test, your blood pressure and heart rhythm will be monitored while you exercise on a treadmill or stationary bicycle. This  information is not intended to replace advice given to you by your health care provider. Make sure you discuss any questions you have with your healthcare provider. Document Revised: 11/02/2019 Document Reviewed: 11/02/2019 Elsevier Patient Education  2022 [employer].

## 2021-09-20 NOTE — Progress Notes (Signed)
Cardiology Office Note:    Date:  09/20/2021   ID:  Todd Ramirez, DOB 1950/08/16, MRN [id number]  PCP:  Cher Nakai, MD  Cardiologist:  Jenean Lindau, MD   Referring MD: Gardiner Rhyme, MD    ASSESSMENT:    1. Coronary artery disease involving native coronary artery of native heart without angina pectoris   2. Mixed hyperlipidemia   3. Obesity (BMI 30.0-34.9)    PLAN:    In order of problems listed above:  Coronary artery disease: Secondary prevention stressed with the patient.  Importance of compliance with diet and medication stressed and vocalized understanding.  Leads a sedentary lifestyle and has some dyspnea on exertion and we will do a stress echo to assess this. Mixed dyslipidemia: Patient on statin therapy and lipids were reviewed.  They were recently done and evaluated from the [facility] sheet they are fine. Diabetes mellitus: Managed by primary care.  Diet emphasized. Obesity: Weight reduction stressed he was advised to walk at least half a mile a day 5 days a week and he promises to do so.  He was advised to begin exercise in a graded fashion after the stress test report was made available to him. Patient will be seen in follow-up appointment in 12 months or earlier if the patient has any concerns    Medication Adjustments/Labs and Tests Ordered: Current medicines are reviewed at length with the patient today.  Concerns regarding medicines are outlined above.  No orders of the defined types were placed in this encounter.  No orders of the defined types were placed in this encounter.    History of Present Illness:    Todd Ramirez is a 71 y.o. male who is being seen today for the evaluation of multiple established for coronary artery disease at the request of Chodri, Nancy Nordmann, MD. patient is a pleasant 71 year old male.  He has past medical history of essential hypertension, dyslipidemia and nonobstructive coronary artery disease.  He is an active gentleman.  He had an echo  which revealed dilated left ventricle but preserved ejection fraction this is the report sent by primary care.  Patient denies any chest pain orthopnea or PND.  Overall he leads a sedentary lifestyle.  At the time of my evaluation, the patient is alert awake oriented and in no distress.  Past Medical History:  Diagnosis Date   BMI 31.0-31.9,adult 11/16/2019   Coronary artery disease involving native coronary artery of native heart without angina pectoris 05/14/2016   COVID-19    Diastasis recti 11/16/2019   Hyperlipidemia    Hypothyroidism    Postoperative examination 12/27/2019   RLS (restless legs syndrome)    Sarcoidosis 1990   Sleep apnea, unspecified 06/20/2016   Special screening examination for viral disease 9/32/3557   Umbilical hernia without obstruction and without gangrene 11/16/2019    Past Surgical History:  Procedure Laterality Date   REPLACEMENT TOTAL KNEE     TONSILLECTOMY      Current Medications: Current Meds  Medication Sig   aspirin EC 81 MG tablet Take 81 mg by mouth daily.   levothyroxine (SYNTHROID) 75 MCG tablet Take 75 mcg by mouth daily.   metFORMIN (GLUCOPHAGE) 500 MG tablet Take 1,000 mg by mouth in the morning and at bedtime.   Multiple Vitamin (MULTI-VITAMINS) TABS Take 1 tablet by mouth daily.   pramipexole (MIRAPEX) 1 MG tablet Take 1 mg by mouth daily.   rosuvastatin (CRESTOR) 5 MG tablet Take 2.5 mg by mouth once a week.  Current Facility-Administered Medications for the 09/20/21 encounter (Office Visit) with Kauri Garson, Reita Cliche, MD  Medication   triamcinolone acetonide (KENALOG) 10 MG/ML injection 10 mg   triamcinolone acetonide (KENALOG) 10 MG/ML injection 10 mg     Allergies:   Patient has no known allergies.   Social History   Socioeconomic History   Marital status: Married    Spouse name: Not on file   Number of children: Not on file   Years of education: Not on file   Highest education level: Not on file  Occupational History   Not on  file  Tobacco Use   Smoking status: Never   Smokeless tobacco: Never  Substance and Sexual Activity   Alcohol use: Not on file   Drug use: Not on file   Sexual activity: Not on file  Other Topics Concern   Not on file  Social History Narrative   Not on file   Social Determinants of Health   Financial Resource Strain: Not on file  Food Insecurity: Not on file  Transportation Needs: Not on file  Physical Activity: Not on file  Stress: Not on file  Social Connections: Not on file     Family History: The patient's family history includes Diabetes in his mother; Heart attack in his father; Hypertension in his father. There is no history of Heart disease or Cancer.  ROS:   Please see the history of present illness.    All other systems reviewed and are negative.  EKGs/Labs/Other Studies Reviewed:    The following studies were reviewed today: EKG reveals sinus rhythm and nonspecific ST-T changes   Recent Labs: No results found for requested labs within last 365 days.  Recent Lipid Panel No results found for: "CHOL", "TRIG", "HDL", "CHOLHDL", "VLDL", "LDLCALC", "LDLDIRECT"  Physical Exam:    VS:  BP 106/62   Pulse 69   Ht '5\' 9"'$  (1.753 m)   Wt 211 lb 3.2 oz (95.8 kg)   SpO2 97%   BMI 31.19 kg/m     Wt Readings from Last 3 Encounters:  09/20/21 211 lb 3.2 oz (95.8 kg)  08/12/16 218 lb 3.2 oz (99 kg)  06/12/16 207 lb (93.9 kg)     GEN: Patient is in no acute distress HEENT: Normal NECK: No JVD; No carotid bruits LYMPHATICS: No lymphadenopathy CARDIAC: S1 S2 regular, 2/6 systolic murmur at the apex. RESPIRATORY:  Clear to auscultation without rales, wheezing or rhonchi  ABDOMEN: Soft, non-tender, non-distended MUSCULOSKELETAL:  No edema; No deformity  SKIN: Warm and dry NEUROLOGIC:  Alert and oriented x 3 PSYCHIATRIC:  Normal affect    Signed, Jenean Lindau, MD  09/20/2021 3:00 PM    [facility]

## 2021-09-21 DIAGNOSIS — R030 Elevated blood-pressure reading, without diagnosis of hypertension: Secondary | ICD-10-CM | POA: Diagnosis not present

## 2021-09-21 DIAGNOSIS — I2584 Coronary atherosclerosis due to calcified coronary lesion: Secondary | ICD-10-CM | POA: Diagnosis not present

## 2021-09-21 DIAGNOSIS — E785 Hyperlipidemia, unspecified: Secondary | ICD-10-CM | POA: Diagnosis not present

## 2021-09-21 DIAGNOSIS — E875 Hyperkalemia: Secondary | ICD-10-CM | POA: Diagnosis not present

## 2021-09-21 DIAGNOSIS — I2510 Atherosclerotic heart disease of native coronary artery without angina pectoris: Secondary | ICD-10-CM | POA: Diagnosis not present

## 2021-09-21 DIAGNOSIS — E1169 Type 2 diabetes mellitus with other specified complication: Secondary | ICD-10-CM | POA: Diagnosis not present

## 2021-09-21 DIAGNOSIS — E039 Hypothyroidism, unspecified: Secondary | ICD-10-CM | POA: Diagnosis not present

## 2021-09-21 DIAGNOSIS — G4730 Sleep apnea, unspecified: Secondary | ICD-10-CM | POA: Diagnosis not present

## 2021-09-21 DIAGNOSIS — G2581 Restless legs syndrome: Secondary | ICD-10-CM | POA: Diagnosis not present

## 2021-09-21 DIAGNOSIS — M722 Plantar fascial fibromatosis: Secondary | ICD-10-CM | POA: Diagnosis not present

## 2021-09-27 DIAGNOSIS — I2510 Atherosclerotic heart disease of native coronary artery without angina pectoris: Secondary | ICD-10-CM | POA: Diagnosis not present

## 2021-10-02 ENCOUNTER — Encounter: Payer: Self-pay | Admitting: Cardiology

## 2021-10-03 DIAGNOSIS — Z0000 Encounter for general adult medical examination without abnormal findings: Secondary | ICD-10-CM | POA: Diagnosis not present

## 2021-10-03 DIAGNOSIS — E785 Hyperlipidemia, unspecified: Secondary | ICD-10-CM | POA: Diagnosis not present

## 2021-10-03 DIAGNOSIS — Z139 Encounter for screening, unspecified: Secondary | ICD-10-CM | POA: Diagnosis not present

## 2021-10-03 DIAGNOSIS — Z9181 History of falling: Secondary | ICD-10-CM | POA: Diagnosis not present

## 2021-10-08 ENCOUNTER — Telehealth: Payer: Self-pay

## 2021-10-08 NOTE — Telephone Encounter (Signed)
Normal stress echo  Results reviewed with pt as per Dr. Julien Nordmann note.  Pt verbalized understanding and had no additional questions.

## 2021-10-09 DIAGNOSIS — E039 Hypothyroidism, unspecified: Secondary | ICD-10-CM | POA: Diagnosis not present

## 2021-10-09 DIAGNOSIS — G2581 Restless legs syndrome: Secondary | ICD-10-CM | POA: Diagnosis not present

## 2021-10-09 DIAGNOSIS — R030 Elevated blood-pressure reading, without diagnosis of hypertension: Secondary | ICD-10-CM | POA: Diagnosis not present

## 2021-10-09 DIAGNOSIS — E875 Hyperkalemia: Secondary | ICD-10-CM | POA: Diagnosis not present

## 2021-10-09 DIAGNOSIS — I2510 Atherosclerotic heart disease of native coronary artery without angina pectoris: Secondary | ICD-10-CM | POA: Diagnosis not present

## 2021-10-09 DIAGNOSIS — E1169 Type 2 diabetes mellitus with other specified complication: Secondary | ICD-10-CM | POA: Diagnosis not present

## 2021-10-09 DIAGNOSIS — E785 Hyperlipidemia, unspecified: Secondary | ICD-10-CM | POA: Diagnosis not present

## 2021-10-09 DIAGNOSIS — G4730 Sleep apnea, unspecified: Secondary | ICD-10-CM | POA: Diagnosis not present

## 2021-10-09 DIAGNOSIS — L309 Dermatitis, unspecified: Secondary | ICD-10-CM | POA: Diagnosis not present

## 2021-10-09 DIAGNOSIS — I2584 Coronary atherosclerosis due to calcified coronary lesion: Secondary | ICD-10-CM | POA: Diagnosis not present

## 2021-10-18 DIAGNOSIS — H9193 Unspecified hearing loss, bilateral: Secondary | ICD-10-CM | POA: Diagnosis not present

## 2021-10-18 DIAGNOSIS — E1169 Type 2 diabetes mellitus with other specified complication: Secondary | ICD-10-CM | POA: Diagnosis not present

## 2021-10-18 DIAGNOSIS — E785 Hyperlipidemia, unspecified: Secondary | ICD-10-CM | POA: Diagnosis not present

## 2021-10-18 DIAGNOSIS — R030 Elevated blood-pressure reading, without diagnosis of hypertension: Secondary | ICD-10-CM | POA: Diagnosis not present

## 2021-10-18 DIAGNOSIS — L309 Dermatitis, unspecified: Secondary | ICD-10-CM | POA: Diagnosis not present

## 2021-10-18 DIAGNOSIS — E039 Hypothyroidism, unspecified: Secondary | ICD-10-CM | POA: Diagnosis not present

## 2021-10-18 DIAGNOSIS — E875 Hyperkalemia: Secondary | ICD-10-CM | POA: Diagnosis not present

## 2021-11-15 DIAGNOSIS — G2581 Restless legs syndrome: Secondary | ICD-10-CM | POA: Diagnosis not present

## 2021-11-15 DIAGNOSIS — E785 Hyperlipidemia, unspecified: Secondary | ICD-10-CM | POA: Diagnosis not present

## 2021-11-15 DIAGNOSIS — E875 Hyperkalemia: Secondary | ICD-10-CM | POA: Diagnosis not present

## 2021-11-15 DIAGNOSIS — R5382 Chronic fatigue, unspecified: Secondary | ICD-10-CM | POA: Diagnosis not present

## 2021-11-15 DIAGNOSIS — H9193 Unspecified hearing loss, bilateral: Secondary | ICD-10-CM | POA: Diagnosis not present

## 2021-11-15 DIAGNOSIS — I2510 Atherosclerotic heart disease of native coronary artery without angina pectoris: Secondary | ICD-10-CM | POA: Diagnosis not present

## 2021-11-15 DIAGNOSIS — E1169 Type 2 diabetes mellitus with other specified complication: Secondary | ICD-10-CM | POA: Diagnosis not present

## 2021-11-15 DIAGNOSIS — L309 Dermatitis, unspecified: Secondary | ICD-10-CM | POA: Diagnosis not present

## 2021-11-15 DIAGNOSIS — R030 Elevated blood-pressure reading, without diagnosis of hypertension: Secondary | ICD-10-CM | POA: Diagnosis not present

## 2021-11-15 DIAGNOSIS — E039 Hypothyroidism, unspecified: Secondary | ICD-10-CM | POA: Diagnosis not present

## 2021-11-27 DIAGNOSIS — D860 Sarcoidosis of lung: Secondary | ICD-10-CM | POA: Diagnosis not present

## 2021-11-27 DIAGNOSIS — G4733 Obstructive sleep apnea (adult) (pediatric): Secondary | ICD-10-CM | POA: Diagnosis not present

## 2021-11-27 DIAGNOSIS — R918 Other nonspecific abnormal finding of lung field: Secondary | ICD-10-CM | POA: Diagnosis not present

## 2021-11-29 DIAGNOSIS — D860 Sarcoidosis of lung: Secondary | ICD-10-CM | POA: Diagnosis not present

## 2021-11-29 DIAGNOSIS — R059 Cough, unspecified: Secondary | ICD-10-CM | POA: Diagnosis not present

## 2021-11-29 DIAGNOSIS — G4733 Obstructive sleep apnea (adult) (pediatric): Secondary | ICD-10-CM | POA: Diagnosis not present

## 2021-11-29 DIAGNOSIS — R918 Other nonspecific abnormal finding of lung field: Secondary | ICD-10-CM | POA: Diagnosis not present

## 2021-11-29 DIAGNOSIS — G2581 Restless legs syndrome: Secondary | ICD-10-CM | POA: Diagnosis not present

## 2021-11-30 DIAGNOSIS — H9193 Unspecified hearing loss, bilateral: Secondary | ICD-10-CM | POA: Diagnosis not present

## 2021-11-30 DIAGNOSIS — G2581 Restless legs syndrome: Secondary | ICD-10-CM | POA: Diagnosis not present

## 2021-11-30 DIAGNOSIS — L309 Dermatitis, unspecified: Secondary | ICD-10-CM | POA: Diagnosis not present

## 2021-11-30 DIAGNOSIS — M25551 Pain in right hip: Secondary | ICD-10-CM | POA: Diagnosis not present

## 2021-11-30 DIAGNOSIS — R030 Elevated blood-pressure reading, without diagnosis of hypertension: Secondary | ICD-10-CM | POA: Diagnosis not present

## 2021-11-30 DIAGNOSIS — E785 Hyperlipidemia, unspecified: Secondary | ICD-10-CM | POA: Diagnosis not present

## 2021-11-30 DIAGNOSIS — E875 Hyperkalemia: Secondary | ICD-10-CM | POA: Diagnosis not present

## 2021-11-30 DIAGNOSIS — E039 Hypothyroidism, unspecified: Secondary | ICD-10-CM | POA: Diagnosis not present

## 2021-11-30 DIAGNOSIS — E1169 Type 2 diabetes mellitus with other specified complication: Secondary | ICD-10-CM | POA: Diagnosis not present

## 2021-11-30 DIAGNOSIS — I2510 Atherosclerotic heart disease of native coronary artery without angina pectoris: Secondary | ICD-10-CM | POA: Diagnosis not present

## 2021-12-14 DIAGNOSIS — R030 Elevated blood-pressure reading, without diagnosis of hypertension: Secondary | ICD-10-CM | POA: Diagnosis not present

## 2021-12-14 DIAGNOSIS — E785 Hyperlipidemia, unspecified: Secondary | ICD-10-CM | POA: Diagnosis not present

## 2021-12-14 DIAGNOSIS — M25551 Pain in right hip: Secondary | ICD-10-CM | POA: Diagnosis not present

## 2021-12-14 DIAGNOSIS — I2510 Atherosclerotic heart disease of native coronary artery without angina pectoris: Secondary | ICD-10-CM | POA: Diagnosis not present

## 2021-12-14 DIAGNOSIS — E875 Hyperkalemia: Secondary | ICD-10-CM | POA: Diagnosis not present

## 2021-12-14 DIAGNOSIS — Z0000 Encounter for general adult medical examination without abnormal findings: Secondary | ICD-10-CM | POA: Diagnosis not present

## 2021-12-14 DIAGNOSIS — E1169 Type 2 diabetes mellitus with other specified complication: Secondary | ICD-10-CM | POA: Diagnosis not present

## 2021-12-14 DIAGNOSIS — E039 Hypothyroidism, unspecified: Secondary | ICD-10-CM | POA: Diagnosis not present

## 2021-12-14 DIAGNOSIS — H9193 Unspecified hearing loss, bilateral: Secondary | ICD-10-CM | POA: Diagnosis not present

## 2021-12-14 DIAGNOSIS — L309 Dermatitis, unspecified: Secondary | ICD-10-CM | POA: Diagnosis not present

## 2021-12-18 DIAGNOSIS — R6889 Other general symptoms and signs: Secondary | ICD-10-CM | POA: Diagnosis not present

## 2021-12-26 ENCOUNTER — Telehealth: Payer: Self-pay | Admitting: Pulmonary Disease

## 2021-12-27 NOTE — Telephone Encounter (Signed)
Called and spoke with patient.  Sleep study results given.  Patient stated he is on cpap, but does not like it.  Patient asked about weight loss and cpap.  Advised weight loss can help with osa and that was one of the recommendations given by Dr. Vaughan Browner 08/12/16. Advised patient if he is having issues or if he would like a sleep consult to review past sleep study and be reassess I could schedule him.  Patient declined at this time.  Advised patient to call office to schedule sleep consult if he decides. Understanding stated.  Nothing further at this time.

## 2021-12-27 NOTE — Telephone Encounter (Signed)
ATC patient.  LM to call back when available.   Patient sleep study was 06/13/16.  Results below-  Marshell Garfinkel, MD      07/01/16 12:13 AM Note   Please let the patient know that the sleep study shows very mild sleep apnea. At this point I would recommend that he treat this by trying to loose weight, we can consider CPAP in future if the issue does not improve. I will discuss further with him at time of return clinic visit.      Follow up OV 08/12/16-  Eval for OSA He is on requip for restless leg syndrome with symptoms of snoring, witnessed apneas and daytime sleepiness. OSA shows very mild sleep apnea. I have recommended that he try weight loss first before attempting CPAP therapy

## 2021-12-27 NOTE — Telephone Encounter (Signed)
Patient returned call states his phone died. Please advise.

## 2021-12-27 NOTE — Telephone Encounter (Signed)
Patient returned call.  Sleep results from 07/01/16 given.  Patient seemed upset over past results and recommendations. Asked patient if he was currently having sleep issues or recent changes?  Patient hung up.  I did attempt to call patient back and received VM.  LM to call office if needed.

## 2023-01-29 ENCOUNTER — Ambulatory Visit: Payer: Medicare Other | Attending: Cardiology | Admitting: Cardiology

## 2023-01-29 ENCOUNTER — Encounter: Payer: Self-pay | Admitting: Cardiology

## 2023-01-29 VITALS — BP 110/60 | HR 82 | Ht 69.0 in | Wt 188.4 lb

## 2023-01-29 DIAGNOSIS — E782 Mixed hyperlipidemia: Secondary | ICD-10-CM

## 2023-01-29 DIAGNOSIS — G4730 Sleep apnea, unspecified: Secondary | ICD-10-CM | POA: Diagnosis not present

## 2023-01-29 DIAGNOSIS — I2510 Atherosclerotic heart disease of native coronary artery without angina pectoris: Secondary | ICD-10-CM | POA: Diagnosis not present

## 2023-01-29 NOTE — Patient Instructions (Signed)
Medication Instructions:  Your physician recommends that you continue on your current medications as directed. Please refer to the Current Medication list given to you today.  *If you need a refill on your cardiac medications before your next appointment, please call your pharmacy*   Lab Work: None ordered If you have labs (blood work) drawn today and your tests are completely normal, you will receive your results only by: MyChart Message (if you have MyChart) OR A paper copy in the mail If you have any lab test that is abnormal or we need to change your treatment, we will call you to review the results.   Testing/Procedures:  Stress Echocardiogram Instructions:    1. You may take all of your medications.  2. No food, drink or tobacco products four hours prior to your test.  3. Dress prepared to exercise. Best to wear 2 piece outfit and tennis shoes. Shoes must be closed toe.  4. Please bring all current prescription medications.    Follow-Up: At [facility], you and your health needs are our priority.  As part of our continuing mission to provide you with exceptional heart care, we have created designated Provider Care Teams.  These Care Teams include your primary Cardiologist (physician) and Advanced Practice Providers (APPs -  Physician Assistants and Nurse Practitioners) who all work together to provide you with the care you need, when you need it.  We recommend signing up for the patient portal called "MyChart".  Sign up information is provided on this After Visit Summary.  MyChart is used to connect with patients for Virtual Visits (Telemedicine).  Patients are able to view lab/test results, encounter notes, upcoming appointments, etc.  Non-urgent messages can be sent to your provider as well.   To learn more about what you can do with MyChart, go to [email or web address].    Your next appointment:   12 month(s)  The format for your next appointment:   In  Person  Provider:   Rajan Revankar, MD   Other Instructions Exercise Stress Echocardiogram An exercise stress echocardiogram is a test to check how well your heart is working. This test uses sound waves and a computer to make pictures of your heart. These pictures will be taken before and after you exercise. For this test, you will walk on a treadmill or ride a bicycle to make your heart beat faster. While you exercise, your heart will be checked with an electrocardiogram (ECG). Your blood pressure will also be checked. You may have this test if: You have chest pain or a heart problem. You had a heart attack or heart surgery not long ago. You have heart valve problems. You have a condition that causes narrowing of the blood vessels that supply your heart. You have a high risk of heart disease and: You are starting a new exercise program. You need to have a big surgery. Tell a doctor about: Any allergies you have. All medicines you are taking. This includes vitamins, herbs, eye drops, creams, and over-the-counter medicines. Any problems you or family members have had with medicines that make you fall asleep (anesthetic medicines). Any surgeries you have had. Any blood disorders you have. Any medical conditions you have. Whether you are pregnant or may be pregnant. What are the risks? Generally, this is a safe test. However, problems may occur, including: Chest pain. Feeling dizzy or light-headed. Shortness of breath. Increased or irregular heartbeat. Feeling like you may vomit (nausea) or vomiting. Heart attack. This is very rare.   What happens before the test? Medicines Ask your doctor about changing or stopping your normal medicines. This is important if you take diabetes medicines or blood thinners. If you use an inhaler, bring it to the test. General instructions Wear comfortable clothes and walking shoes. Follow instructions from your doctor about what you cannot eat or  drink before the test. Do not drink or eat anything that has caffeine in it. Stop having caffeine 24 hours before the test. Do not smoke or use products that contain nicotine or tobacco for 4 hours before the test. If you need help quitting, ask your doctor. What happens during the test?  You will take off your clothes from the waist up and put on a hospital gown. Electrodes or patches will be put on your chest. A blood pressure cuff will be put on your arm. Before you exercise, a computer will make a picture of your heart. To do this: You will lie down and a gel will be put on your chest. A wand will be moved over the gel. Sound waves from the wand will go to the computer to make the picture. Then, you will start to exercise. You may walk on a treadmill or pedal a bicycle. Your blood pressure and heart rhythm will be checked while you exercise. The exercise will get harder or faster. You will exercise until: Your heart reaches a certain level. You are too tired to go on. You cannot go on because of chest pain, weakness, or dizziness. You will lie down right away so another picture of your heart can be taken. The procedure may vary among doctors and hospitals. What can I expect after the test? After your test, it is common to have: Mild soreness. Mild tiredness. Your heart rate and blood pressure will be checked until they return to your normal levels. You should not have any new symptoms after this test. Follow these instructions at home: If your doctor says that you can, you may: Eat what you normally eat. Do your normal activities. Take over-the-counter and prescription medicines only as told by your doctor. Keep all follow-up visits. It is up to you to get the results of your test. Ask how to get your results when they are ready. Contact a doctor if: You feel dizzy or light-headed. You have a fast or irregular heartbeat. You feel like you may vomit or you vomit. You have a  headache. You feel short of breath. Get help right away if: You develop pain or pressure: In your chest. In your jaw or neck. Between your shoulders. That goes down your left arm. You faint. You have trouble breathing. These symptoms may be an emergency. Get medical help right away. Call your local emergency services (911 in the [place] Do not wait to see if the symptoms will go away. Do not drive yourself to the hospital. Summary This is a test that checks how well your heart is working. Follow instructions about what you cannot eat or drink before the test. Ask your doctor if you should take your normal medicines before the test. Stop having caffeine 24 hours before the test. Do not smoke or use products with nicotine or tobacco in them for 4 hours before the test. During the test, your blood pressure and heart rhythm will be checked while you exercise. This information is not intended to replace advice given to you by your health care provider. Make sure you discuss any questions you have with your health care   provider. Document Revised: 11/22/2020 Document Reviewed: 11/02/2019 Elsevier Patient Education  2022 [employer]    

## 2023-01-29 NOTE — Progress Notes (Signed)
Cardiology Office Note:    Date:  01/29/2023   ID:  Todd Ramirez, DOB Nov 27, 1950, MRN [id number]  PCP:  Simone Curia, MD  Cardiologist:  Garwin Brothers, MD   Referring MD: Simone Curia, MD    ASSESSMENT:    1. Mixed hyperlipidemia   2. Coronary artery disease involving native coronary artery of native heart without angina pectoris   3. Sleep apnea, unspecified type    PLAN:    In order of problems listed above:  Coronary artery calcification: Secondary prevention stressed with the patient.  Importance of compliance with diet medication stressed any vocalized understanding.  He occasionally has some dyspnea on exertion.  He has no chest pain.  I will set him up for an exercise stress echo in view of atherosclerotic vascular disease.  He is agreeable. Mixed lipidemia: On lipid-lowering medications followed by primary care.  Goal LDL must be less than 70. Sleep apnea: Sleep health issues were discussed and he is compliant with this. I mentioned to him that if his stress test is negative he should initiate exercise graded program and he agrees. Patient will be seen in follow-up appointment in 6 months or earlier if the patient has any concerns.    Medication Adjustments/Labs and Tests Ordered: Current medicines are reviewed at length with the patient today.  Concerns regarding medicines are outlined above.  Orders Placed This Encounter  Procedures   EKG 12-Lead   ECHOCARDIOGRAM STRESS TEST   No orders of the defined types were placed in this encounter.    No chief complaint on file.    History of Present Illness:    Todd Ramirez is a 72 y.o. male.  Patient has past medical history of coronary artery calcification, aortic atherosclerosis, mixed dyslipidemia and sleep apnea.  He denies any problems at this time and takes care of activities of daily living.  No chest pain orthopnea or PND.  At the time of my evaluation, the patient is alert awake oriented and in no distress.  Past  Medical History:  Diagnosis Date   BMI 31.0-31.9,adult 11/16/2019   Coronary artery disease involving native coronary artery of native heart without angina pectoris 05/14/2016   COVID-19    Diastasis recti 11/16/2019   Hyperlipidemia    Hypothyroidism    Obesity (BMI 30.0-34.9) 09/20/2021   Postoperative examination 12/27/2019   RLS (restless legs syndrome)    Sarcoidosis 1990   Sleep apnea, unspecified 06/20/2016   Special screening examination for viral disease 11/16/2019   Umbilical hernia without obstruction and without gangrene 11/16/2019    Past Surgical History:  Procedure Laterality Date   REPLACEMENT TOTAL KNEE     TONSILLECTOMY      Current Medications: Current Meds  Medication Sig   aspirin EC 81 MG tablet Take 81 mg by mouth daily.   levothyroxine (SYNTHROID) 75 MCG tablet Take 75 mcg by mouth daily before breakfast.   lisinopril (ZESTRIL) 10 MG tablet Take 10 mg by mouth daily.   metFORMIN (GLUCOPHAGE) 500 MG tablet Take 1,000 mg by mouth in the morning and at bedtime.   Multiple Vitamin (MULTI-VITAMINS) TABS Take 1 tablet by mouth daily.   pramipexole (MIRAPEX) 1 MG tablet Take 1 mg by mouth at bedtime.   rosuvastatin (CRESTOR) 5 MG tablet Take 2.5 mg by mouth once a week.   tamsulosin (FLOMAX) 0.4 MG CAPS capsule Take 0.4 mg by mouth daily.   Current Facility-Administered Medications for the 01/29/23 encounter (Office Visit) with Montine Hight, Aundra Dubin, MD  Medication   triamcinolone acetonide (KENALOG) 10 MG/ML injection 10 mg   triamcinolone acetonide (KENALOG) 10 MG/ML injection 10 mg     Allergies:   Patient has no known allergies.   Social History   Socioeconomic History   Marital status: Married    Spouse name: Not on file   Number of children: Not on file   Years of education: Not on file   Highest education level: Not on file  Occupational History   Not on file  Tobacco Use   Smoking status: Never   Smokeless tobacco: Never  Substance and  Sexual Activity   Alcohol use: Not on file   Drug use: Not on file   Sexual activity: Not on file  Other Topics Concern   Not on file  Social History Narrative   Not on file   Social Determinants of Health   Financial Resource Strain: Not on file  Food Insecurity: Not on file  Transportation Needs: Not on file  Physical Activity: Not on file  Stress: Not on file  Social Connections: Not on file     Family History: The patient's family history includes Diabetes in his mother; Heart attack in his father; Hypertension in his father. There is no history of Heart disease or Cancer.  ROS:   Please see the history of present illness.    All other systems reviewed and are negative.  EKGs/Labs/Other Studies Reviewed:    The following studies were reviewed today: .Marland KitchenEKG Interpretation Date/Time:  Wednesday January 29 2023 14:52:23 EST Ventricular Rate:  82 PR Interval:  134 QRS Duration:  92 QT Interval:  354 QTC Calculation: 413 R Axis:   -12  Text Interpretation: Sinus rhythm with Premature supraventricular complexes Left ventricular hypertrophy Abnormal ECG No previous ECGs available Confirmed by Belva Crome [phone]) on 01/29/2023 2:09:31 PM     Recent Labs: No results found for requested labs within last 365 days.  Recent Lipid Panel No results found for: "CHOL", "TRIG", "HDL", "CHOLHDL", "VLDL", "LDLCALC", "LDLDIRECT"  Physical Exam:    VS:  BP 110/60   Pulse 82   Ht 5\' 9"  (1.753 m)   Wt 188 lb 6.4 oz (85.5 kg)   SpO2 94%   BMI 27.82 kg/m     Wt Readings from Last 3 Encounters:  01/29/23 188 lb 6.4 oz (85.5 kg)  09/20/21 211 lb 3.2 oz (95.8 kg)  08/12/16 218 lb 3.2 oz (99 kg)     GEN: Patient is in no acute distress HEENT: Normal NECK: No JVD; No carotid bruits LYMPHATICS: No lymphadenopathy CARDIAC: Hear sounds regular, 2/6 systolic murmur at the apex. RESPIRATORY:  Clear to auscultation without rales, wheezing or rhonchi  ABDOMEN: Soft, non-tender,  non-distended MUSCULOSKELETAL:  No edema; No deformity  SKIN: Warm and dry NEUROLOGIC:  Alert and oriented x 3 PSYCHIATRIC:  Normal affect   Signed, Garwin Brothers, MD  01/29/2023 2:10 PM    [facility]

## 2023-02-19 ENCOUNTER — Telehealth: Payer: Self-pay

## 2023-02-19 NOTE — Telephone Encounter (Signed)
Detailed instructions left on the patient's answering machine. Asked to call back with any questions. S.Aby Gessel CCT

## 2023-02-26 ENCOUNTER — Ambulatory Visit: Payer: Medicare Other

## 2023-02-26 ENCOUNTER — Ambulatory Visit: Payer: Medicare Other | Attending: Cardiology

## 2023-02-26 DIAGNOSIS — I2510 Atherosclerotic heart disease of native coronary artery without angina pectoris: Secondary | ICD-10-CM | POA: Diagnosis not present

## 2024-01-27 ENCOUNTER — Other Ambulatory Visit: Payer: Self-pay

## 2024-01-29 ENCOUNTER — Encounter: Payer: Self-pay | Admitting: Cardiology

## 2024-01-29 ENCOUNTER — Ambulatory Visit: Attending: Cardiology | Admitting: Cardiology

## 2024-01-29 VITALS — BP 118/72 | HR 69 | Ht 69.0 in | Wt 199.2 lb

## 2024-01-29 DIAGNOSIS — G4730 Sleep apnea, unspecified: Secondary | ICD-10-CM | POA: Diagnosis not present

## 2024-01-29 DIAGNOSIS — E66811 Obesity, class 1: Secondary | ICD-10-CM | POA: Diagnosis not present

## 2024-01-29 DIAGNOSIS — I2510 Atherosclerotic heart disease of native coronary artery without angina pectoris: Secondary | ICD-10-CM

## 2024-01-29 DIAGNOSIS — E782 Mixed hyperlipidemia: Secondary | ICD-10-CM

## 2024-01-29 NOTE — Patient Instructions (Signed)

## 2024-01-29 NOTE — Progress Notes (Signed)
 Cardiology Office Note:    Date:  01/29/2024   ID:  Todd Ramirez, DOB 05-16-1950, MRN [id number]  PCP:  Todd Chow, MD  Cardiologist:  Todd JONELLE Crape, MD   Referring MD: Todd Chow, MD    ASSESSMENT:    1. Coronary artery disease involving native coronary artery of native heart without angina pectoris   2. Sleep apnea, unspecified type   3. Obesity (BMI 30.0-34.9)   4. Mixed hyperlipidemia    PLAN:    In order of problems listed above:  Coronary artery disease:Primary prevention stressed with the patient.  Importance of compliance with diet medication stressed and patient verbalized standing.  He was advised to walk at least half an hour a day on a daily basis. Essential hypertension: Blood pressure is stable and diet was emphasized.  Salt intake issues and lifestyle modification urged Mixed dyslipidemia: On lipid-lowering medications followed by primary care.  Goal LDL less than 60. Diabetes mellitus: Diet emphasized.  Weight reduction stressed.  Risks of obesity explained and he promises to do better. Patient will be seen in follow-up appointment in 6 months or earlier if the patient has any concerns.    Medication Adjustments/Labs and Tests Ordered: Current medicines are reviewed at length with the patient today.  Concerns regarding medicines are outlined above.  Orders Placed This Encounter  Procedures   EKG 12-Lead   No orders of the defined types were placed in this encounter.    No chief complaint on file.    History of Present Illness:    Todd Ramirez is a 73 y.o. male.  Patient has past medical history of coronary artery disease, essential hypertension, mixed dyslipidemia, sleep apnea.  He denies any problems at this time and takes care of activities of daily living.  He mentions to me that he has been slack on exercise.  He leads a sedentary lifestyle.  At the time of my evaluation, the patient is alert awake oriented and in no distress.  Past Medical History:   Diagnosis Date   BMI 31.0-31.9,adult 11/16/2019   Coronary artery disease involving native coronary artery of native heart without angina pectoris 05/14/2016   COVID-19    Diastasis recti 11/16/2019   Hyperlipidemia    Hypothyroidism    Obesity (BMI 30.0-34.9) 09/20/2021   Postoperative examination 12/27/2019   RLS (restless legs syndrome)    Sarcoidosis 1990   Sleep apnea, unspecified 06/20/2016   Special screening examination for viral disease 11/16/2019   Umbilical hernia without obstruction and without gangrene 11/16/2019    Past Surgical History:  Procedure Laterality Date   REPLACEMENT TOTAL KNEE     TONSILLECTOMY      Current Medications: Current Meds  Medication Sig   aspirin EC 81 MG tablet Take 81 mg by mouth daily.   cetirizine (ZYRTEC) 10 MG tablet Take 10 mg by mouth daily.   levothyroxine (SYNTHROID) 75 MCG tablet Take 75 mcg by mouth daily before breakfast.   lisinopril (ZESTRIL) 10 MG tablet Take 10 mg by mouth daily.   metFORMIN (GLUCOPHAGE) 500 MG tablet Take 1,000 mg by mouth in the morning and at bedtime.   montelukast (SINGULAIR) 10 MG tablet Take 10 mg by mouth daily.   Multiple Vitamin (MULTI-VITAMINS) TABS Take 1 tablet by mouth daily.   pramipexole (MIRAPEX) 1 MG tablet Take 1 mg by mouth at bedtime.   rosuvastatin (CRESTOR) 5 MG tablet Take 2.5 mg by mouth once a week.   tamsulosin (FLOMAX) 0.4 MG CAPS capsule Take  0.4 mg by mouth daily.   Current Facility-Administered Medications for the 01/29/24 encounter (Office Visit) with Carsin Randazzo R, MD  Medication   triamcinolone  acetonide (KENALOG ) 10 MG/ML injection 10 mg   triamcinolone  acetonide (KENALOG ) 10 MG/ML injection 10 mg     Allergies:   Patient has no known allergies.   Social History   Socioeconomic History   Marital status: Married    Spouse name: Not on file   Number of children: Not on file   Years of education: Not on file   Highest education level: Not on file   Occupational History   Not on file  Tobacco Use   Smoking status: Never   Smokeless tobacco: Never  Substance and Sexual Activity   Alcohol use: Not on file   Drug use: Not on file   Sexual activity: Not on file  Other Topics Concern   Not on file  Social History Narrative   Not on file   Social Drivers of Health   Financial Resource Strain: Not on file  Food Insecurity: Not on file  Transportation Needs: Not on file  Physical Activity: Not on file  Stress: Not on file  Social Connections: Not on file     Family History: The patient's family history includes Diabetes in his mother; Heart attack in his father; Hypertension in his father. There is no history of Heart disease or Cancer.  ROS:   Please see the history of present illness.    All other systems reviewed and are negative.  EKGs/Labs/Other Studies Reviewed:    The following studies were reviewed today: .SABRAEKG Interpretation Date/Time:  Thursday January 29 2024 10:23:51 EST Ventricular Rate:  69 PR Interval:  138 QRS Duration:  96 QT Interval:  400 QTC Calculation: 428 R Axis:   -16  Text Interpretation: Normal sinus rhythm Left ventricular hypertrophy Abnormal ECG When compared with ECG of 29-Jan-2023 14:52, Premature supraventricular complexes are no longer Present Confirmed by Edwyna Backers [phone]) on 01/29/2024 10:43:51 AM     Recent Labs: No results found for requested labs within last 365 days.  Recent Lipid Panel No results found for: CHOL, TRIG, HDL, CHOLHDL, VLDL, LDLCALC, LDLDIRECT  Physical Exam:    VS:  BP 118/72   Pulse 69   Ht 5' 9 (1.753 m)   Wt 199 lb 3.2 oz (90.4 kg)   SpO2 98%   BMI 29.42 kg/m     Wt Readings from Last 3 Encounters:  01/29/24 199 lb 3.2 oz (90.4 kg)  01/29/23 188 lb 6.4 oz (85.5 kg)  09/20/21 211 lb 3.2 oz (95.8 kg)     GEN: Patient is in no acute distress HEENT: Normal NECK: No JVD; No carotid bruits LYMPHATICS: No  lymphadenopathy CARDIAC: Hear sounds regular, 2/6 systolic murmur at the apex. RESPIRATORY:  Clear to auscultation without rales, wheezing or rhonchi  ABDOMEN: Soft, non-tender, non-distended MUSCULOSKELETAL:  No edema; No deformity  SKIN: Warm and dry NEUROLOGIC:  Alert and oriented x 3 PSYCHIATRIC:  Normal affect   Signed, Backers JONELLE Edwyna, MD  01/29/2024 10:44 AM    [facility]
